# Patient Record
Sex: Male | Born: 1950 | Race: Black or African American | Hispanic: No | Marital: Married | Smoking: Former smoker
Health system: Southern US, Community
[De-identification: ages and names within clinical notes are randomized; demographics above are authoritative.]

## PROBLEM LIST (undated history)

## (undated) DIAGNOSIS — C801 Malignant (primary) neoplasm, unspecified: Secondary | ICD-10-CM

## (undated) DIAGNOSIS — R9430 Abnormal result of cardiovascular function study, unspecified: Secondary | ICD-10-CM

## (undated) DIAGNOSIS — I429 Cardiomyopathy, unspecified: Secondary | ICD-10-CM

## (undated) DIAGNOSIS — N400 Enlarged prostate without lower urinary tract symptoms: Secondary | ICD-10-CM

## (undated) DIAGNOSIS — I10 Essential (primary) hypertension: Secondary | ICD-10-CM

## (undated) DIAGNOSIS — R51 Headache: Secondary | ICD-10-CM

## (undated) DIAGNOSIS — N529 Male erectile dysfunction, unspecified: Secondary | ICD-10-CM

## (undated) DIAGNOSIS — C679 Malignant neoplasm of bladder, unspecified: Secondary | ICD-10-CM

## (undated) DIAGNOSIS — I519 Heart disease, unspecified: Secondary | ICD-10-CM

## (undated) DIAGNOSIS — E785 Hyperlipidemia, unspecified: Secondary | ICD-10-CM

## (undated) DIAGNOSIS — N3289 Other specified disorders of bladder: Secondary | ICD-10-CM

## (undated) HISTORY — PX: BLADDER TUMOR EXCISION: SHX238

## (undated) HISTORY — DX: Cardiomyopathy, unspecified: I42.9

## (undated) HISTORY — DX: Heart disease, unspecified: I51.9

## (undated) HISTORY — DX: Abnormal result of cardiovascular function study, unspecified: R94.30

## (undated) HISTORY — DX: Male erectile dysfunction, unspecified: N52.9

---

## 2007-08-12 ENCOUNTER — Encounter (INDEPENDENT_AMBULATORY_CARE_PROVIDER_SITE_OTHER): Payer: Self-pay | Admitting: Urology

## 2007-08-12 ENCOUNTER — Ambulatory Visit (HOSPITAL_BASED_OUTPATIENT_CLINIC_OR_DEPARTMENT_OTHER): Admission: RE | Admit: 2007-08-12 | Discharge: 2007-08-12 | Payer: Self-pay | Admitting: Urology

## 2010-05-30 ENCOUNTER — Ambulatory Visit
Admission: RE | Admit: 2010-05-30 | Discharge: 2010-05-30 | Payer: Self-pay | Source: Home / Self Care | Admitting: Urology

## 2010-09-23 LAB — POCT I-STAT 4, (NA,K, GLUC, HGB,HCT)
Glucose, Bld: 131 mg/dL — ABNORMAL HIGH (ref 70–99)
HCT: 39 % (ref 39.0–52.0)
HCT: 41 % (ref 39.0–52.0)
Hemoglobin: 13.9 g/dL (ref 13.0–17.0)
Potassium: 3.3 mEq/L — ABNORMAL LOW (ref 3.5–5.1)
Sodium: 141 mEq/L (ref 135–145)

## 2010-09-23 LAB — POCT HEMOGLOBIN-HEMACUE: Hemoglobin: 10.2 g/dL — ABNORMAL LOW (ref 13.0–17.0)

## 2010-10-03 ENCOUNTER — Other Ambulatory Visit: Payer: Self-pay | Admitting: Urology

## 2010-10-03 ENCOUNTER — Ambulatory Visit (HOSPITAL_BASED_OUTPATIENT_CLINIC_OR_DEPARTMENT_OTHER)
Admission: RE | Admit: 2010-10-03 | Discharge: 2010-10-03 | Disposition: A | Discharge status: Home / Self Care | Payer: Commercial Managed Care - PPO | Source: Ambulatory Visit | Attending: Urology | Admitting: Urology

## 2010-10-03 DIAGNOSIS — I10 Essential (primary) hypertension: Secondary | ICD-10-CM | POA: Insufficient documentation

## 2010-10-03 DIAGNOSIS — F17200 Nicotine dependence, unspecified, uncomplicated: Secondary | ICD-10-CM | POA: Insufficient documentation

## 2010-10-03 DIAGNOSIS — C674 Malignant neoplasm of posterior wall of bladder: Secondary | ICD-10-CM | POA: Insufficient documentation

## 2010-10-03 DIAGNOSIS — Z01812 Encounter for preprocedural laboratory examination: Secondary | ICD-10-CM | POA: Insufficient documentation

## 2010-10-03 DIAGNOSIS — Z79899 Other long term (current) drug therapy: Secondary | ICD-10-CM | POA: Insufficient documentation

## 2010-10-03 LAB — POCT I-STAT 4, (NA,K, GLUC, HGB,HCT)
Glucose, Bld: 104 mg/dL — ABNORMAL HIGH (ref 70–99)
HCT: 41 % (ref 39.0–52.0)
Potassium: 3.1 mEq/L — ABNORMAL LOW (ref 3.5–5.1)
Sodium: 143 mEq/L (ref 135–145)

## 2010-10-08 NOTE — Op Note (Signed)
  Kenneth Lawrence, Kenneth Lawrence                 ACCOUNT NO.:  [id number]  MEDICAL RECORD NO.:  [id number]          PATIENT TYPE:  AMB  LOCATION:  [facility]                         FACILITY:  [facility]  PHYSICIAN:  Chandi Nicklin I. [name], M.D.DATE OF BIRTH:  1951/03/22  DATE OF PROCEDURE:  10/03/2010 DATE OF DISCHARGE:  05/30/2010                              OPERATIVE REPORT   PREOPERATIVE DIAGNOSIS:  Recurrent transitional cell carcinoma of the bladder.  POSTOPERATIVE DIAGNOSES:  Recurrent transitional cell carcinoma of the bladder.  OPERATIONS:  Cystourethroscopy, cold cup bladder biopsy, cauterization of the biopsy sites.  SURGEON:  Kollins Fenter I. [name], M.D.  ANESTHESIA:  General LMA.  PREOP PREPARATION:  After appropriate preanesthesia, the patient is brought to the operating room, placed on the operating room in dorsal supine position where general LMA anesthesia was introduced.  He was then replaced in dorsal lithotomy position where the pubis was prepped with Betadine solution and draped in usual fashion.  REVIEW OF HISTORY:  This 60 year old male has a history of recurrent high-grade, but noninvasive TCC of the bladder, requiring occasional TUR bladder tumors.  He is status post BCG bladder wash chemotherapy in the past, but still has had recurrences.  He has had grade recurrent gross hematuria since 2009.  Cystoscopy now shows recurrent tumor.  He has a positive NMP-22, PSA 2.3 and continues to smoke cigarettes occasionally. CT is negative.  Pathology has shown high-grade, but not invasive TCC.  DESCRIPTION OF PROCEDURE:  Cystourethroscopy was accomplished and shows recurrence of the back wall of the bladder cancer.  Cold cup biopsy is used to excise this tumor and the biopsy site was cauterized with the gyrus button electrode.  The patient had no bleeding.  The bladder was drained off fluid.  The patient was awakened and taken to recovery room in good condition.  He had B and O  suppository at the beginning of the procedure, Toradol at the end of procedure.  He was awakened and taken to the recovery room in good condition.     Damiano Stamper I. [name], M.D.     SIT/MEDQ  D:  10/03/2010  T:  10/03/2010  Job:  [id number]  Electronically Signed by Jethro Bolus M.D. on 10/08/2010 09:38:32 AM

## 2010-11-25 NOTE — Op Note (Signed)
Kenneth Lawrence, Kenneth Lawrence                 ACCOUNT NO.:  [id number]   MEDICAL RECORD NO.:  [id number]          PATIENT TYPE:  AMB   LOCATION:  [facility]                         FACILITY:  [facility]   PHYSICIAN:  Sigmund I. [name], M.D.DATE OF BIRTH:  1950-10-17   DATE OF PROCEDURE:  08/12/2007  DATE OF DISCHARGE:                               OPERATIVE REPORT   PREOPERATIVE DIAGNOSIS:  Recurrent bladder cancer.   POSTOPERATIVE DIAGNOSIS:  Recurrent bladder cancer.   OPERATION:  Cystourethroscopy, cold cup bladder biopsy, TUR bladder  tumor.   SURGEON:  Sigmund I. [name], M.D.   ANESTHESIA:  General LMA.   PREPARATION:  After appropriate preanesthesia, the patient was brought  to the operating room, placed on the operating room in dorsal supine  position where general LMA anesthesia was induced.  He was then replaced  in the dorsal lithotomy position where the pubis was prepped with  Betadine solution and draped in usual fashion.   REVIEW OF HISTORY:  Kenneth Lawrence is a 60 year old male, with a history of  recurrent bladder cancer, but none for approximately 20 years.  He has  developed recurrent gross hematuria, with positive NMP 22 and positive  cystoscopy, and is now for transurethral resection of bladder tumor.   PROCEDURE:  Cystoscopy was accomplished; it shows a right lateral  bladder wall bladder cancer.  Cold-cup bladder biopsies obtained, and  TUR resection was obtained.  No other areas of erythema or abnormality  were identified with the bladder.  The tissue was photo documented and  sent to laboratory for examination.  The patient was given IV Toradol,  awakened, and taken to the recovery room in good condition.  In  addition, the patient received a B&O suppository at the beginning of  procedure.      Sigmund I. [name], M.D.  Electronically Signed     SIT/MEDQ  D:  08/12/2007  T:  08/13/2007  Job:  [id number]

## 2011-04-03 LAB — I-STAT 8, (EC8 V) (CONVERTED LAB)
Bicarbonate: 27.1 — ABNORMAL HIGH
Glucose, Bld: 114 — ABNORMAL HIGH
Hemoglobin: 14.3
Sodium: 139
TCO2: 28
pCO2, Ven: 34.8 — ABNORMAL LOW
pH, Ven: 7.499 — ABNORMAL HIGH

## 2013-02-22 ENCOUNTER — Other Ambulatory Visit: Payer: Self-pay | Admitting: Urology

## 2013-03-01 ENCOUNTER — Encounter (HOSPITAL_COMMUNITY): Payer: Self-pay | Admitting: Pharmacy Technician

## 2013-03-09 ENCOUNTER — Encounter (HOSPITAL_COMMUNITY)
Admission: RE | Admit: 2013-03-09 | Discharge: 2013-03-09 | Disposition: A | Discharge status: Home / Self Care | Payer: Commercial Managed Care - PPO | Source: Ambulatory Visit | Attending: Urology | Admitting: Urology

## 2013-03-09 ENCOUNTER — Encounter (HOSPITAL_COMMUNITY): Payer: Self-pay

## 2013-03-09 ENCOUNTER — Ambulatory Visit (HOSPITAL_COMMUNITY)
Admission: RE | Admit: 2013-03-09 | Discharge: 2013-03-09 | Disposition: A | Discharge status: Home / Self Care | Payer: Commercial Managed Care - PPO | Source: Ambulatory Visit | Attending: Urology | Admitting: Urology

## 2013-03-09 DIAGNOSIS — Z01812 Encounter for preprocedural laboratory examination: Secondary | ICD-10-CM | POA: Insufficient documentation

## 2013-03-09 DIAGNOSIS — I10 Essential (primary) hypertension: Secondary | ICD-10-CM | POA: Insufficient documentation

## 2013-03-09 DIAGNOSIS — Z01818 Encounter for other preprocedural examination: Secondary | ICD-10-CM | POA: Insufficient documentation

## 2013-03-09 DIAGNOSIS — N3289 Other specified disorders of bladder: Secondary | ICD-10-CM | POA: Insufficient documentation

## 2013-03-09 HISTORY — DX: Other specified disorders of bladder: N32.89

## 2013-03-09 HISTORY — DX: Essential (primary) hypertension: I10

## 2013-03-09 HISTORY — DX: Hyperlipidemia, unspecified: E78.5

## 2013-03-09 HISTORY — DX: Headache: R51

## 2013-03-09 HISTORY — DX: Benign prostatic hyperplasia without lower urinary tract symptoms: N40.0

## 2013-03-09 HISTORY — DX: Malignant (primary) neoplasm, unspecified: C80.1

## 2013-03-09 LAB — CBC
HCT: 31.8 % — ABNORMAL LOW (ref 39.0–52.0)
Hemoglobin: 10.4 g/dL — ABNORMAL LOW (ref 13.0–17.0)
MCH: 26.7 pg (ref 26.0–34.0)
MCHC: 32.7 g/dL (ref 30.0–36.0)
MCV: 81.7 fL (ref 78.0–100.0)

## 2013-03-09 LAB — BASIC METABOLIC PANEL
BUN: 19 mg/dL (ref 6–23)
Calcium: 9.4 mg/dL (ref 8.4–10.5)
Creatinine, Ser: 1.51 mg/dL — ABNORMAL HIGH (ref 0.50–1.35)
GFR calc non Af Amer: 47 mL/min — ABNORMAL LOW (ref >90)
Glucose, Bld: 106 mg/dL — ABNORMAL HIGH (ref 70–99)

## 2013-03-09 NOTE — Patient Instructions (Addendum)
20 ALEC JAROS  03/09/2013   Your procedure is scheduled on: 9-4  -2014  Report to [facility] at   0630     AM.  Call this number if you have problems the morning of surgery: [phone]  Or Presurgical Testing [phone](Jaydalynn Olivero)      Do not eat food:After Midnight.    Take these medicines the morning of surgery with A SIP OF WATER: Amlodipine. Carvedilol. Pravastatin. Stop Aspirin x5 days prior. Stop all over the counter vitamin/herbal supplements.   Do not wear jewelry, make-up or nail polish.  Do not wear lotions, powders, or perfumes. You may wear deodorant.  Do not shave 12 hours prior to first CHG shower(legs and under arms).(face and neck okay.)  Do not bring valuables to the hospital.  Contacts, dentures or bridgework,body piercing,  may not be worn into surgery.  Leave suitcase in the car. After surgery it may be brought to your room.  For patients admitted to the hospital, checkout time is 11:00 AM the day of discharge.   Patients discharged the day of surgery will not be allowed to drive home. Must have responsible person with you x 24 hours once discharged.  Name and phone number of your driver: Salil Raineri [phone] cell  Special Instructions: CHG(Chlorhedine 4%-"Hibiclens","Betasept","Aplicare") Shower Use Special Wash: see special instructions.(avoid face and genitals)      Failure to follow these instructions may result in Cancellation of your surgery.   Patient signature_______________________________________________________

## 2013-03-09 NOTE — Pre-Procedure Instructions (Addendum)
03-09-13 EKG 10'13- report with chart

## 2013-03-16 ENCOUNTER — Inpatient Hospital Stay (HOSPITAL_COMMUNITY)
Admission: RE | Admit: 2013-03-16 | Discharge: 2013-03-18 | DRG: 657 | Disposition: A | Discharge status: Home / Self Care | Payer: Commercial Managed Care - PPO | Source: Ambulatory Visit | Attending: Urology | Admitting: Urology

## 2013-03-16 ENCOUNTER — Encounter (HOSPITAL_COMMUNITY): Payer: Self-pay | Admitting: *Deleted

## 2013-03-16 ENCOUNTER — Ambulatory Visit (HOSPITAL_COMMUNITY): Payer: Commercial Managed Care - PPO | Admitting: Anesthesiology

## 2013-03-16 ENCOUNTER — Other Ambulatory Visit: Payer: Self-pay | Admitting: Radiology

## 2013-03-16 ENCOUNTER — Encounter (HOSPITAL_COMMUNITY)
Admission: RE | Disposition: A | Discharge status: Home / Self Care | Payer: Self-pay | Source: Ambulatory Visit | Attending: Urology

## 2013-03-16 ENCOUNTER — Encounter (HOSPITAL_COMMUNITY): Payer: Self-pay | Admitting: Anesthesiology

## 2013-03-16 ENCOUNTER — Inpatient Hospital Stay (HOSPITAL_COMMUNITY): Payer: Commercial Managed Care - PPO

## 2013-03-16 DIAGNOSIS — I10 Essential (primary) hypertension: Secondary | ICD-10-CM | POA: Diagnosis present

## 2013-03-16 DIAGNOSIS — F17200 Nicotine dependence, unspecified, uncomplicated: Secondary | ICD-10-CM | POA: Diagnosis present

## 2013-03-16 DIAGNOSIS — E559 Vitamin D deficiency, unspecified: Secondary | ICD-10-CM | POA: Diagnosis present

## 2013-03-16 DIAGNOSIS — Z8551 Personal history of malignant neoplasm of bladder: Secondary | ICD-10-CM

## 2013-03-16 DIAGNOSIS — R634 Abnormal weight loss: Secondary | ICD-10-CM | POA: Diagnosis present

## 2013-03-16 DIAGNOSIS — R339 Retention of urine, unspecified: Secondary | ICD-10-CM | POA: Diagnosis not present

## 2013-03-16 DIAGNOSIS — C672 Malignant neoplasm of lateral wall of bladder: Principal | ICD-10-CM | POA: Diagnosis present

## 2013-03-16 DIAGNOSIS — N1330 Unspecified hydronephrosis: Secondary | ICD-10-CM | POA: Diagnosis present

## 2013-03-16 DIAGNOSIS — N135 Crossing vessel and stricture of ureter without hydronephrosis: Secondary | ICD-10-CM | POA: Diagnosis present

## 2013-03-16 DIAGNOSIS — R310 Gross hematuria: Secondary | ICD-10-CM | POA: Diagnosis present

## 2013-03-16 DIAGNOSIS — Z8546 Personal history of malignant neoplasm of prostate: Secondary | ICD-10-CM

## 2013-03-16 DIAGNOSIS — E785 Hyperlipidemia, unspecified: Secondary | ICD-10-CM | POA: Diagnosis present

## 2013-03-16 DIAGNOSIS — C679 Malignant neoplasm of bladder, unspecified: Secondary | ICD-10-CM

## 2013-03-16 HISTORY — PX: TRANSURETHRAL RESECTION OF BLADDER TUMOR: SHX2575

## 2013-03-16 LAB — BASIC METABOLIC PANEL
Calcium: 9.4 mg/dL (ref 8.4–10.5)
Chloride: 103 mEq/L (ref 96–112)
Creatinine, Ser: 1.44 mg/dL — ABNORMAL HIGH (ref 0.50–1.35)
GFR calc Af Amer: 58 mL/min — ABNORMAL LOW (ref >90)
GFR calc non Af Amer: 50 mL/min — ABNORMAL LOW (ref >90)

## 2013-03-16 LAB — CBC
HCT: 35.9 % — ABNORMAL LOW (ref 39.0–52.0)
Platelets: 284 10*3/uL (ref 150–400)
RDW: 13.4 % (ref 11.5–15.5)
WBC: 8.9 10*3/uL (ref 4.0–10.5)

## 2013-03-16 LAB — APTT: aPTT: 32 seconds (ref 24–37)

## 2013-03-16 LAB — PROTIME-INR: INR: 0.96 (ref 0.00–1.49)

## 2013-03-16 SURGERY — TURBT (TRANSURETHRAL RESECTION OF BLADDER TUMOR)
Anesthesia: General | Wound class: Clean Contaminated

## 2013-03-16 MED ORDER — INDIGOTINDISULFONATE SODIUM 8 MG/ML IJ SOLN
INTRAMUSCULAR | Status: DC | PRN
  Administered 2013-03-16: 5 mL via INTRAVENOUS

## 2013-03-16 MED ORDER — CARVEDILOL 12.5 MG PO TABS
12.5000 mg | ORAL_TABLET | Freq: Two times a day (BID) | ORAL | Status: DC
  Administered 2013-03-16 – 2013-03-18 (×4): 12.5 mg via ORAL
  Filled 2013-03-16 (×6): qty 1

## 2013-03-16 MED ORDER — HYDROCHLOROTHIAZIDE 25 MG PO TABS
25.0000 mg | ORAL_TABLET | Freq: Every morning | ORAL | Status: DC
  Administered 2013-03-16 – 2013-03-18 (×3): 25 mg via ORAL
  Filled 2013-03-16 (×3): qty 1

## 2013-03-16 MED ORDER — FENTANYL CITRATE 0.05 MG/ML IJ SOLN
INTRAMUSCULAR | Status: DC | PRN
  Administered 2013-03-16: 25 ug via INTRAVENOUS
  Administered 2013-03-16 (×3): 50 ug via INTRAVENOUS
  Administered 2013-03-16: 25 ug via INTRAVENOUS

## 2013-03-16 MED ORDER — DIPHENHYDRAMINE HCL 12.5 MG/5ML PO ELIX: 12.5000 mg | ORAL_SOLUTION | Freq: Four times a day (QID) | ORAL | Status: DC | PRN

## 2013-03-16 MED ORDER — PROMETHAZINE HCL 25 MG/ML IJ SOLN: 6.2500 mg | INTRAMUSCULAR | Status: DC | PRN

## 2013-03-16 MED ORDER — BACITRACIN-NEOMYCIN-POLYMYXIN 400-5-5000 EX OINT: 1.0000 "application " | TOPICAL_OINTMENT | Freq: Three times a day (TID) | CUTANEOUS | Status: DC | PRN

## 2013-03-16 MED ORDER — LIDOCAINE HCL 1 % IJ SOLN
INTRAMUSCULAR | Status: DC | PRN
  Administered 2013-03-16: 100 mg via INTRADERMAL

## 2013-03-16 MED ORDER — ONDANSETRON HCL 4 MG/2ML IJ SOLN: 4.0000 mg | INTRAMUSCULAR | Status: DC | PRN

## 2013-03-16 MED ORDER — AMLODIPINE BESYLATE 10 MG PO TABS
10.0000 mg | ORAL_TABLET | Freq: Every morning | ORAL | Status: DC
  Administered 2013-03-17 – 2013-03-18 (×2): 10 mg via ORAL
  Filled 2013-03-16 (×2): qty 1

## 2013-03-16 MED ORDER — MAGNESIUM CITRATE PO SOLN: 1.0000 | Freq: Once | ORAL | Status: AC | PRN

## 2013-03-16 MED ORDER — OXYCODONE-ACETAMINOPHEN 5-325 MG PO TABS: 1.0000 | ORAL_TABLET | ORAL | Status: DC | PRN

## 2013-03-16 MED ORDER — IOHEXOL 300 MG/ML  SOLN
INTRAMUSCULAR | Status: AC
  Filled 2013-03-16: qty 1

## 2013-03-16 MED ORDER — SODIUM CHLORIDE 0.9 % IR SOLN
Status: DC | PRN
  Administered 2013-03-16: 18000 mL via INTRAVESICAL

## 2013-03-16 MED ORDER — FENTANYL CITRATE 0.05 MG/ML IJ SOLN
25.0000 ug | INTRAMUSCULAR | Status: DC | PRN
  Administered 2013-03-16 (×2): 50 ug via INTRAVENOUS

## 2013-03-16 MED ORDER — POTASSIUM CHLORIDE CRYS ER 20 MEQ PO TBCR
20.0000 meq | EXTENDED_RELEASE_TABLET | Freq: Every day | ORAL | Status: DC
  Administered 2013-03-16 – 2013-03-18 (×3): 20 meq via ORAL
  Filled 2013-03-16 (×3): qty 1

## 2013-03-16 MED ORDER — MIDAZOLAM HCL 2 MG/2ML IJ SOLN
INTRAMUSCULAR | Status: AC
  Filled 2013-03-16: qty 4

## 2013-03-16 MED ORDER — DIPHENHYDRAMINE HCL 50 MG/ML IJ SOLN: 12.5000 mg | Freq: Four times a day (QID) | INTRAMUSCULAR | Status: DC | PRN

## 2013-03-16 MED ORDER — HYDROMORPHONE HCL PF 1 MG/ML IJ SOLN
0.5000 mg | INTRAMUSCULAR | Status: DC | PRN
Start: 2013-03-16 — End: 2013-03-18

## 2013-03-16 MED ORDER — BISACODYL 5 MG PO TBEC: 5.0000 mg | DELAYED_RELEASE_TABLET | Freq: Every day | ORAL | Status: DC | PRN

## 2013-03-16 MED ORDER — SODIUM CHLORIDE 0.45 % IV SOLN
INTRAVENOUS | Status: DC
  Administered 2013-03-16: 17:00:00 100 mL/h via INTRAVENOUS
  Administered 2013-03-17 (×2): via INTRAVENOUS
  Administered 2013-03-17: 11:00:00 100 mL/h via INTRAVENOUS
  Administered 2013-03-18: 07:00:00 via INTRAVENOUS

## 2013-03-16 MED ORDER — LABETALOL HCL 5 MG/ML IV SOLN
INTRAVENOUS | Status: DC | PRN
  Administered 2013-03-16: 5 mg via INTRAVENOUS

## 2013-03-16 MED ORDER — SIMVASTATIN 5 MG PO TABS
5.0000 mg | ORAL_TABLET | Freq: Every day | ORAL | Status: DC
  Administered 2013-03-16 – 2013-03-17 (×2): 5 mg via ORAL
  Filled 2013-03-16 (×4): qty 1

## 2013-03-16 MED ORDER — LACTATED RINGERS IV SOLN
INTRAVENOUS | Status: DC
  Administered 2013-03-16: 1000 mL via INTRAVENOUS
  Administered 2013-03-16: 10:00:00 via INTRAVENOUS

## 2013-03-16 MED ORDER — ZOLPIDEM TARTRATE 5 MG PO TABS: 5.0000 mg | ORAL_TABLET | Freq: Every evening | ORAL | Status: DC | PRN

## 2013-03-16 MED ORDER — CEFAZOLIN SODIUM-DEXTROSE 2-3 GM-% IV SOLR
INTRAVENOUS | Status: AC
  Filled 2013-03-16: qty 50

## 2013-03-16 MED ORDER — MEPERIDINE HCL 50 MG/ML IJ SOLN: 6.2500 mg | INTRAMUSCULAR | Status: DC | PRN

## 2013-03-16 MED ORDER — CIPROFLOXACIN IN D5W 400 MG/200ML IV SOLN: 400.0000 mg | Freq: Once | INTRAVENOUS | Status: DC

## 2013-03-16 MED ORDER — IOHEXOL 300 MG/ML  SOLN
10.0000 mL | Freq: Once | INTRAMUSCULAR | Status: AC | PRN
  Administered 2013-03-16: 5 mL

## 2013-03-16 MED ORDER — CEFAZOLIN SODIUM-DEXTROSE 2-3 GM-% IV SOLR
2.0000 g | INTRAVENOUS | Status: AC
  Administered 2013-03-16: 15:00:00 2 g via INTRAVENOUS
  Filled 2013-03-16: qty 50

## 2013-03-16 MED ORDER — BELLADONNA ALKALOIDS-OPIUM 16.2-60 MG RE SUPP
RECTAL | Status: AC
  Filled 2013-03-16: qty 1

## 2013-03-16 MED ORDER — LACTATED RINGERS IV SOLN: INTRAVENOUS | Status: DC

## 2013-03-16 MED ORDER — INDIGOTINDISULFONATE SODIUM 8 MG/ML IJ SOLN
INTRAMUSCULAR | Status: AC
  Filled 2013-03-16: qty 10

## 2013-03-16 MED ORDER — FENTANYL CITRATE 0.05 MG/ML IJ SOLN
INTRAMUSCULAR | Status: AC
  Filled 2013-03-16: qty 2

## 2013-03-16 MED ORDER — CEFAZOLIN SODIUM-DEXTROSE 2-3 GM-% IV SOLR
2.0000 g | INTRAVENOUS | Status: AC
  Administered 2013-03-16: 2 g via INTRAVENOUS

## 2013-03-16 MED ORDER — PROPOFOL 10 MG/ML IV BOLUS
INTRAVENOUS | Status: DC | PRN
  Administered 2013-03-16: 200 mg via INTRAVENOUS

## 2013-03-16 MED ORDER — OMEGA-3 FATTY ACIDS 1000 MG PO CAPS: 2.0000 g | ORAL_CAPSULE | Freq: Every day | ORAL | Status: DC

## 2013-03-16 MED ORDER — FENTANYL CITRATE 0.05 MG/ML IJ SOLN
INTRAMUSCULAR | Status: AC | PRN
  Administered 2013-03-16: 100 ug via INTRAVENOUS

## 2013-03-16 MED ORDER — BELLADONNA ALKALOIDS-OPIUM 16.2-60 MG RE SUPP
RECTAL | Status: DC | PRN
  Administered 2013-03-16: 1 via RECTAL

## 2013-03-16 MED ORDER — FENTANYL CITRATE 0.05 MG/ML IJ SOLN
INTRAMUSCULAR | Status: AC
  Filled 2013-03-16: qty 4

## 2013-03-16 MED ORDER — MIDAZOLAM HCL 5 MG/5ML IJ SOLN
INTRAMUSCULAR | Status: DC | PRN
  Administered 2013-03-16: 2 mg via INTRAVENOUS

## 2013-03-16 MED ORDER — VITAMIN C 500 MG PO TABS
500.0000 mg | ORAL_TABLET | Freq: Every day | ORAL | Status: DC
  Administered 2013-03-16 – 2013-03-18 (×3): 500 mg via ORAL
  Filled 2013-03-16 (×3): qty 1

## 2013-03-16 MED ORDER — VITAMIN D3 25 MCG (1000 UNIT) PO TABS
2000.0000 [IU] | ORAL_TABLET | Freq: Every day | ORAL | Status: DC
  Administered 2013-03-16 – 2013-03-18 (×3): 2000 [IU] via ORAL
  Filled 2013-03-16 (×3): qty 2

## 2013-03-16 MED ORDER — OXYBUTYNIN CHLORIDE 5 MG PO TABS
5.0000 mg | ORAL_TABLET | Freq: Three times a day (TID) | ORAL | Status: DC | PRN
  Administered 2013-03-17: 02:00:00 5 mg via ORAL
  Filled 2013-03-16: qty 1

## 2013-03-16 MED ORDER — MIDAZOLAM HCL 2 MG/2ML IJ SOLN
INTRAMUSCULAR | Status: AC | PRN
  Administered 2013-03-16: 2 mg via INTRAVENOUS

## 2013-03-16 MED ORDER — CIPROFLOXACIN HCL 500 MG PO TABS
500.0000 mg | ORAL_TABLET | Freq: Two times a day (BID) | ORAL | Status: DC
  Administered 2013-03-16 – 2013-03-18 (×4): 500 mg via ORAL
  Filled 2013-03-16 (×6): qty 1

## 2013-03-16 MED ORDER — ONDANSETRON HCL 4 MG/2ML IJ SOLN
INTRAMUSCULAR | Status: DC | PRN
  Administered 2013-03-16: 4 mg via INTRAVENOUS

## 2013-03-16 MED ORDER — OMEGA-3-ACID ETHYL ESTERS 1 G PO CAPS
1.0000 g | ORAL_CAPSULE | Freq: Every day | ORAL | Status: DC
  Administered 2013-03-16 – 2013-03-18 (×3): 1 g via ORAL
  Filled 2013-03-16 (×3): qty 1

## 2013-03-16 SURGICAL SUPPLY — 22 items
BAG URINE DRAINAGE (UROLOGICAL SUPPLIES) ×2 IMPLANT
BAG URO CATCHER STRL LF (DRAPE) ×3 IMPLANT
CATH HEMA 3WAY 30CC 24FR COUDE (CATHETERS) ×2 IMPLANT
CLOTH BEACON ORANGE TIMEOUT ST (SAFETY) ×3 IMPLANT
DRAPE CAMERA CLOSED 9X96 (DRAPES) ×3 IMPLANT
ELECT BUTTON HF 24-28F 2 30DE (ELECTRODE) ×1 IMPLANT
ELECT LOOP MED HF 24F 12D (CUTTING LOOP) ×1 IMPLANT
ELECT LOOP MED HF 24F 12D CBL (CLIP) ×3 IMPLANT
ELECT RESECT VAPORIZE 12D CBL (ELECTRODE) ×3 IMPLANT
GLOVE BIOGEL M STRL SZ7.5 (GLOVE) ×3 IMPLANT
GOWN STRL NON-REIN LRG LVL3 (GOWN DISPOSABLE) ×3 IMPLANT
GOWN STRL REIN XL XLG (GOWN DISPOSABLE) ×3 IMPLANT
HOLDER FOLEY CATH W/STRAP (MISCELLANEOUS) IMPLANT
IV NS IRRIG 3000ML ARTHROMATIC (IV SOLUTION) ×2 IMPLANT
KIT ASPIRATION TUBING (SET/KITS/TRAYS/PACK) ×3 IMPLANT
MANIFOLD NEPTUNE II (INSTRUMENTS) ×3 IMPLANT
NS IRRIG 1000ML POUR BTL (IV SOLUTION) ×3 IMPLANT
PACK CYSTO (CUSTOM PROCEDURE TRAY) ×3 IMPLANT
SCRUB PCMX 4 OZ (MISCELLANEOUS) ×3 IMPLANT
SYR 30ML LL (SYRINGE) ×2 IMPLANT
SYRINGE IRR TOOMEY STRL 70CC (SYRINGE) IMPLANT
TUBING CONNECTING 10 (TUBING) ×3 IMPLANT

## 2013-03-16 NOTE — H&P (Signed)
Problems  1. Acute Cystitis 595.0 2. Benign Localized Prostatic Hyperplasia With Urinary Obstruction 600.21 3. Carcinoma Of The Bladder 188.9 4. Dysuria 788.1 5. Gross Hematuria 599.71 6. Microscopic Hematuria 599.72 7. Paternal history of  Prostate Cancer V16.42 8. Urinary Frequency 788.41 9. Urinary Frequency 788.41 10. Working diagnosis of  Urinary Tract Infection 599.0 11. Visit For: Screening Exam Malignant Neoplasm Prostate V76.44 12. Vitamin D Deficiency 268.9  PSA,Elevated 790.93    History of Present Illness           62 yo AA male seen today for intermittent gross hematuria and hx of bladder cancer. He has co-incidental 8 lb weight loss in the last 4 months. He is s/p cysto/TURBT on 10/03/10.  Path showed low grade TCC, no invasion.  He complains of low grade urethral discomfort and low back pain with voiding, probably associatecd with gross hematuria. Last cysto negative in April, 2014.         Patient has completed his 1st cycle of full strength BCG/Intron A (last treatment was on 09/05/10).  He did well with the treatments w/o any problems.  Hx of 25 years post high grade, but non-invasive, TCC bladder.  He was treated with BCG bladder wash chemotherapy in the past, and has had recurrence, in Jan 2009, with complaint of gross hematuria. Cysto showed recurrence of tumor, and TUR-BT path again showed high grade non-invasive TCC.       He is voiding well now with IPSS=7/7. Still smokes cigarettes occasionally, about 1 pack/week. Occasional cigar. (boredom)  04/27/12  PSA - 3.74 04/21/11  PSA - 4.06          05/06/10  PSA was 2.83.   Past Medical History Problems  1. History of  Bladder Cancer V10.51 2. History of  Hypercholesterolemia 272.0 3. History of  Hypertension 401.9 4. History of  Murmurs 785.2  Surgical History Problems  1. History of  Cystoscopy Bladder Tumor 596.9 2. History of  Cystoscopy With Fulguration Small Lesion (5-51mm) 3. History of  Cystoscopy  With Fulguration Small Lesion (5-20mm) 4. History of  Cystoscopy With Fulguration Small Lesion (5-40mm)  Current Meds 1. AmLODIPine Besylate 10 MG Oral Tablet; Therapy: (Recorded:31Dec2008) to 2. Aspirin 81 MG Oral Tablet; Therapy: (Recorded:31Dec2008) to 3. Carvedilol 12.5 MG Oral Tablet; Therapy: (Recorded:31Dec2008) to 4. Ciprofloxacin HCl 500 MG Oral Tablet; TAKE 1 TABLET TWICE DAILY; Therapy: 07Aug2014 to  (Evaluate:14Aug2014)  Requested for: 07Aug2014; Last Rx:07Aug2014 5. Fish Oil 1000 MG Oral Capsule; Therapy: (Recorded:25Oct2011) to 6. Hydrochlorothiazide 25 MG Oral Tablet; Therapy: (Recorded:31Dec2008) to 7. Klor-Con M20 TBCR; Therapy: (Recorded:31Dec2008) to 8. Levitra 20 MG Oral Tablet; Therapy: (Recorded:25Oct2011) to 9. Pravastatin Sodium 20 MG Oral Tablet; Therapy: (Recorded:31Dec2008) to 10. Vitamin C TABS; Therapy: (Recorded:25Oct2011) to 11. Vitamin D3 1000 UNIT Oral Tablet; Therapy: (Recorded:25Oct2011) to  Allergies Medication  1. No Known Drug Allergies  Family History Problems  1. Paternal history of  Brain Cancer V16.8 2. Maternal history of  Breast Cancer V16.3 3. Family history of  Family Health Status Number Of Children 2 sons, 1 daughter 4. Family history of  Father Deceased At Age ____ 63, brain cancer 5. Paternal history of  Heart Disease V17.49 6. Paternal history of  Hematuria 7. Family history of  Mother Deceased At Age ____ 36, breast cancer 8. Sororal history of  Nephrolithiasis 9. Paternal history of  Prostate Cancer V16.42 10. Paternal history of  Stroke Syndrome V17.1  Social History Problems  1. Alcohol Use 2 per day 2.  Marital History - Currently Married 3. Occupation: Merchandiser, retail 4. Tobacco Use V15.82 1/2 ppd x 3 yrs Denied  5. Caffeine Use  Review of Systems Constitutional, skin, eye, otolaryngeal, hematologic/lymphatic, cardiovascular, pulmonary, endocrine, musculoskeletal, gastrointestinal, neurological and psychiatric  system(s) were reviewed and pertinent findings if present are noted.  Genitourinary: feelings of urinary urgency, dysuria, nocturia, hematuria and inguinal pain, but no difficulty starting the urinary stream, urine stream is not weak, urinary stream does not start and stop, no incomplete emptying of bladder, no post-void dribbling, no urethral discharge, urine is not foul-smelling, urine not cloudy and initiating urination does not require straining.  Gastrointestinal: constipation, but no nausea, no vomiting and no diarrhea.  Constitutional: feeling tired (fatigue) and recent 8 lbs lb weight loss.  Psychiatric: anxiety.    Physical Exam Constitutional: Well nourished and well developed . No acute distress.  ENT:. The ears and nose are normal in appearance.  Pulmonary: No respiratory distress and normal respiratory rhythm and effort.  Cardiovascular: Heart rate and rhythm are normal . No peripheral edema.  Abdomen: The abdomen is soft and nontender. No masses are palpated. No CVA tenderness. No hernias are palpable. No hepatosplenomegaly noted.  Rectal: Rectal exam demonstrates normal sphincter tone, no tenderness, no masses and no residual hemorrhoidal skin tags seen. Estimated prostate size is 3+. Normal rectal tone, no rectal masses, prostate is smooth, symmetric and non-tender. The prostate has no nodularity and is not tender. The left seminal vesicle is nonpalpable. The right seminal vesicle is nonpalpable. The perineum is normal on inspection, no perineal tenderness.  Genitourinary: Examination of the penis demonstrates no tenderness, no discharge, no masses, no adherence of the prepuce, no lesions, a normal meatus and no meatal stenosis. The penis is uncircumcised. The scrotum is normal in appearance, not tender and without lesions. The right vas deferens is is palpably normal. The left vas deferens is palpably normal. The right epididymis is palpably normal and non-tender. The left epididymis is  palpably normal and non-tender. The right testis is palpably normal, non-tender and without masses. The left testis is normal, non-tender and without masses.  Lymphatics: The femoral and inguinal nodes are not enlarged or tender.  Skin: Normal skin turgor, no visible rash and no visible skin lesions.  Neuro/Psych:. Mood and affect are appropriate.    Results/Data Selected Results  AU CT-HEMATURIA PROTOCOL 07Aug2014 12:00AM Seward Grater   Test Name Result Flag Reference  ** RADIOLOGY REPORT BY [facility] **   *RADIOLOGY REPORT*  Clinical Data: Hematuria. History of bladder cancer.  CT ABDOMEN AND PELVIS WITHOUT AND WITH CONTRAST  Technique: Multidetector CT imaging of the abdomen and pelvis was performed without contrast material in one or both body regions, followed by contrast material(s) and further sections in one or both body regions.  Contrast: 125 ml Isovue 300 IV  Comparison: 05/08/2010  Findings: Mild dependent atelectasis in the bilateral lower lobes.  Liver, spleen, pancreas, and adrenal glands are within normal limits.  Gallbladder is unremarkable. No intrahepatic or extrahepatic ductal dilatation.  Mildly diminished enhancement of the right kidney (relative to the left) with moderate right hydroureter nephrosis. Left kidney is within normal limits.  No evidence of bowel obstruction.  Atherosclerotic calcifications of the abdominal aorta and branch vessels.  No abdominopelvic ascites.  No suspicious abdominopelvic lymphadenopathy.  Prostate is within the upper limits of normal for size.  5.1 x 3.2 x 4.9 cm mass along the right posterolateral bladder wall (series 3/image 78), reflecting primary bladder neoplasm. Possible  perivesical extension/involvement of the distal right ureter (series 3/image 78).  The right ureter remains unopacified on delayed imaging. The filling defect related to the suspected bladder neoplasm persists (series  7/image 53).  Mild degenerative changes of the visualized thoracolumbar spine.  IMPRESSION: 5.1 cm right posterolateral bladder mass, reflecting primary bladder neoplasm.  Possible perivascular extension/involvement of the distal right ureter. Cystoscopic correlation is suggested.  Secondary moderate right hydroureteronephrosis.  No evidence of metastatic disease in the abdomen/pelvis.   Original Report Authenticated By: Charline Bills, M.D.   Procedure  Procedure: Cystoscopy  Chaperone Present: laurra.  Indication: Hematuria. History of Urothelial Carcinoma.  Informed Consent: Risks, benefits, and potential adverse events were discussed and informed consent was obtained from the patient.  Prep: The patient was prepped with betadine.  Anesthesia:. Local anesthesia was administered intraurethrally with 2% lidocaine jelly.  Antibiotic prophylaxis: Ciprofloxacin.  Procedure Note:  Urethral meatus:. No abnormalities.  Anterior urethra: No abnormalities.  Prostatic urethra:. The lateral prostatic lobes were enlarged. No intravesical median lobe was visualized.  Bladder: Visulization was clear. The right ureteral orifice was not able to be identified. The left ureteral orifice was in the normal anatomic position. A solitary tumor was visualized in the bladder. A nodular tumor was seen in the bladder measuring approximately 6 cm in size. This tumor was located on the right side, on the anterior aspect, near the trigone of the bladder. The patient tolerated the procedure well.  Complications: None.    Assessment Assessed  1. Carcinoma Of The Bladder 188.9 2. Gross Hematuria 599.71 3. Paternal history of  Prostate Cancer V40.42   62 yo AA male with 5.5 cm R sided trigonal and side wall bladder mass, with Right ureteral obstruction and hydronephrosis by CT scan. he has8 lb weight loss in last 4 months. He has had multiple bladder cancers over the last 30+ years, and never had invasive  disease, but this would appear to be a large muscle invasive mass. he has a family hx of prostate cancer, ( father), but he has normal rectal exam and normal psa.    He needs TUR-BT and IV indigo carmine, and possibly perc nephrostomy tube. Further Dx/Rx pending pathology report.   Plan Carcinoma Of The Bladder (188.9)  1. CREATININE with eGFR  Requested for: 08Aug2014 2. Follow-up After Test Office  Follow-up  Requested for: 08Aug2014 FamHx: Prostate Cancer (V16.42)  3. PSA REFLEX TO FREE  Requested for: 08Aug2014 Health Maintenance (V70.0)  4. UA With REFLEX  Done: 08Aug2014   Schedule surgery ASAP.   Signatures Electronically signed by : Jethro Bolus, M.D.; Feb 17 2013  9:36AM

## 2013-03-16 NOTE — Op Note (Signed)
Pre-operative diagnosis :   10 cm right lateral bladder wall bladder mass  Postoperative diagnosis: Same     Operation:  Cystourethroscopy, transurethral resection of 10 cm right lateral bladder wall bladder mass   Surgeon:  S. [name], MD  First assistant:  None   Anesthesia:  General LMA   Preparation:  After appropriate preanesthesia, the patient was brought to the operating room, placed on the operating table in the dorsal supine position where general LMA anesthesia was introduced. He was replaced in the dorsal lithotomy position with pubis was prepped with Betadine solution and draped in usual fashion. Armband was double checked. History was double checked.   Review history:  62 yo AA male seen today for intermittent gross hematuria and hx of bladder cancer. He has co-incidental 8 lb weight loss in the last 4 months. He is s/p cysto/TURBT on 10/03/10. Path showed low grade TCC, no invasion. He complains of low grade urethral discomfort and low back pain with voiding, probably associatecd with gross hematuria. Last cysto negative in April, 2014.  Patient has completed his 1st cycle of full strength BCG/Intron A (last treatment was on 09/05/10). He did well with the treatments w/o any problems. Hx of 25 years post high grade, but non-invasive, TCC bladder. He was treated with BCG bladder wash chemotherapy in the past, and has had recurrence, in Jan 2009, with complaint of gross hematuria. Cysto showed recurrence of tumor, and TUR-BT path again showed high grade non-invasive TCC.  He is voiding well now with IPSS=7/7. Still smokes cigarettes occasionally, about 1 pack/week. Occasional cigar. (boredom)  04/27/12 PSA - 3.74  04/21/11 PSA - 4.06  05/06/10 PSA was 2.83.    Statement of  Likelihood of Success: Excellent. TIME-OUT observed.:  Procedure:  Cystourethroscopy was accomplished, and shows open bladder neck, with minimal by lobar BPH. Within the bladder neck, on the right side, a large  solid mass was identified. The right hemitrigone was not identifiable. The left hemitrigone appeared normal. There was no ureteral orifice identifiable. Indigocarmine was given, but no indigo carmine was ever identified, either from the left or the right orifice. There was blue contrast seen in the bladder during the procedure, however.  The continuous flow resectoscope sheath was placed, and resection was accomplished of a very large right-sided bladder wall bladder mass with photodocumentation accomplished. The mass appeared to be highly necrotic, and highly vascular. The mass also appeared to have much mucus within it. Masses thought to represent a necrotic transitional cell carcinoma, transitional cell carcinoma invasive into the median lobe of prostate, adenocarcinoma of the bladder, or metastatic adenocarcinoma of the rectum into the bladder. Chips of the resection were sent to the pathologist with notes of concern. The patient underwent extensive coagulation of the resection bed, and a size 24 three-way hematuria catheter was placed with mild traction, and continuous irrigation. The patient was awakened, and taken to recovery room in good condition. He received IV antibiotic at the beginning of the procedure.

## 2013-03-16 NOTE — Care Management Note (Signed)
    Page 1 of 1   03/16/2013     1:34:28 PM   CARE MANAGEMENT NOTE 03/16/2013  Patient:  Kenneth Lawrence, Kenneth Lawrence   Account Number:  [id number]  Date Initiated:  03/16/2013  Documentation initiated by:  Lanier Clam  Subjective/Objective Assessment:   62Y/O M ADMITTED W/R BLADDER WALL MASS.     Action/Plan:   FROM HOME W/SPOUSE.HAS PCP,PHARMACY.   Anticipated DC Date:  03/17/2013   Anticipated DC Plan:  HOME/SELF CARE      DC Planning Services  CM consult      Choice offered to / List presented to:             Status of service:  In process, will continue to follow Medicare Important Message given?   (If response is "NO", the following Medicare IM given date fields will be blank) Date Medicare IM given:   Date Additional Medicare IM given:    Discharge Disposition:    Per UR Regulation:  Reviewed for med. necessity/level of care/duration of stay  If discussed at Long Length of Stay Meetings, dates discussed:    Comments:  03/16/13 Terasa Orsini RN,BSN NCM [phone] S/P CYSTOURETHROSCOPY.

## 2013-03-16 NOTE — Anesthesia Postprocedure Evaluation (Signed)
  Anesthesia Post-op Note  Patient: Kenneth Lawrence  Procedure(s) Performed: Procedure(s) (LRB): TRANSURETHRAL RESECTION OF BLADDER TUMOR (TURBT) (N/A)  Patient Location: PACU  Anesthesia Type: General  Level of Consciousness: awake and alert   Airway and Oxygen Therapy: Patient Spontanous Breathing  Post-op Pain: mild  Post-op Assessment: Post-op Vital signs reviewed, Patient's Cardiovascular Status Stable, Respiratory Function Stable, Patent Airway and No signs of Nausea or vomiting  Last Vitals:  Filed Vitals:   03/16/13 1145  BP: 150/70  Pulse: 62  Temp: 36.7 C  Resp:     Post-op Vital Signs: stable   Complications: No apparent anesthesia complications

## 2013-03-16 NOTE — Progress Notes (Signed)
Patient ID: Kenneth Lawrence, male   DOB: 1951/03/23, 62 y.o.   MRN: [id number] Request received for placement of a right percutaneous nephrostomy tube in pt with hx of hematuria, recurrent bladder carcinoma (right bladder mass- s/p TURBT 9/4), ureteral orifice obstruction and right hydronephrosis. Additional PMH as below. Imaging studies were reviewed by Dr. Lowella Dandy. Exam: pt awake/sl drowsy; chest- CTA bilat; heart- RRR; abd- soft,+BS,NT; ext- FROM, no edema; no rt CVA tenderness.   Filed Vitals:   03/16/13 1100 03/16/13 1115 03/16/13 1130 03/16/13 1145  BP: 162/74 160/77 149/71 150/70  Pulse: 72 72 70 62  Temp:   98.2 F (36.8 C) 98.1 F (36.7 C)  TempSrc:    Oral  Resp: 12 12 13    Height:    5\' 9"  (1.753 m)  Weight:    170 lb (77.111 kg)  SpO2: 100% 100% 100% 100%   Past Medical History  Diagnosis Date  . Hypertension   . Bladder mass   . Prostate enlargement   . Hyperlipidemia   . Headache(784.0)     hx. migraines  . Cancer     bladder cancer-hx. of past tumors   Past Surgical History  Procedure Laterality Date  . Bladder tumor excision      Multiple times , none in 10 yrs  Dg Chest 2 View  03/09/2013   CLINICAL DATA:  Preop prostate surgery. History of hypertension, smoker.  EXAM: CHEST  2 VIEW  COMPARISON:  None.  FINDINGS: The heart size and mediastinal contours are within normal limits. Both lungs are clear. The visualized skeletal structures are unremarkable.  IMPRESSION: No active cardiopulmonary disease.   Electronically Signed   By: Charlett Nose   On: 03/09/2013 09:53  Results for orders placed during the hospital encounter of 03/09/13  CBC      Result Value Range   WBC 5.3  4.0 - 10.5 K/uL   RBC 3.89 (*) 4.22 - 5.81 MIL/uL   Hemoglobin 10.4 (*) 13.0 - 17.0 g/dL   HCT 40.9 (*) 81.1 - 91.4 %   MCV 81.7  78.0 - 100.0 fL   MCH 26.7  26.0 - 34.0 pg   MCHC 32.7  30.0 - 36.0 g/dL   RDW 78.2  95.6 - 21.3 %   Platelets 261  150 - 400 K/uL  BASIC METABOLIC PANEL      Result  Value Range   Sodium 137  135 - 145 mEq/L   Potassium 3.4 (*) 3.5 - 5.1 mEq/L   Chloride 99  96 - 112 mEq/L   CO2 30  19 - 32 mEq/L   Glucose, Bld 106 (*) 70 - 99 mg/dL   BUN 19  6 - 23 mg/dL   Creatinine, Ser 0.86 (*) 0.50 - 1.35 mg/dL   Calcium 9.4  8.4 - 57.8 mg/dL   GFR calc non Af Amer 47 (*) >90 mL/min   GFR calc Af Amer 55 (*) >90 mL/min   A/P: Pt with hx of recurrent bladder carcinoma; now with right hydronephrosis, ureteral orifice obstructions, renal insuff., s/p TURBT 9/4 am. Plan is for right PCN placement today. Details/risks of procedure d/w pt/wife with their understanding and consent.

## 2013-03-16 NOTE — Transfer of Care (Signed)
Immediate Anesthesia Transfer of Care Note  Patient: Kenneth Lawrence  Procedure(s) Performed: Procedure(s): TRANSURETHRAL RESECTION OF BLADDER TUMOR (TURBT) (N/A)  Patient Location: PACU  Anesthesia Type:General  Level of Consciousness: awake, alert  and oriented  Airway & Oxygen Therapy: Patient Spontanous Breathing and Patient connected to face mask oxygen  Post-op Assessment: Report given to PACU RN, Post -op Vital signs reviewed and stable and Patient moving all extremities X 4  Post vital signs: Reviewed and stable  Complications: No apparent anesthesia complications

## 2013-03-16 NOTE — Interval H&P Note (Signed)
History and Physical Interval Note:  03/16/2013 9:13 AM  Kenneth Lawrence  has presented today for surgery, with the diagnosis of Bladder Mass  The various methods of treatment have been discussed with the patient and family. After consideration of risks, benefits and other options for treatment, the patient has consented to  Procedure(s) with comments: TRANSURETHRAL RESECTION OF BLADDER TUMOR (TURBT) (N/A) - 90 mins req for this case  C-ARM  **OUTPATIENT WITH OBSERVATION**  INDIGO CARMINE IV AT BEGINNING OF CASE CYSTOSCOPY WITH RETROGRADE PYELOGRAM/URETERAL STENT PLACEMENT (Right) - POSS (RT) RPG  POSS JJ STENT as a surgical intervention .  The patient's history has been reviewed, patient examined, no change in status, stable for surgery.  I have reviewed the patient's chart and labs.  Questions were answered to the patient's satisfaction.     [name]

## 2013-03-16 NOTE — Anesthesia Preprocedure Evaluation (Addendum)
Anesthesia Evaluation  Patient identified by MRN, date of birth, ID band Patient awake    Reviewed: Allergy & Precautions, H&P , NPO status , Patient's Chart, lab work & pertinent test results  Airway Mallampati: II TM Distance: >3 FB Neck ROM: Full    Dental no notable dental hx. (+) Caps   Pulmonary neg pulmonary ROS, Current Smoker,  breath sounds clear to auscultation  Pulmonary exam normal       Cardiovascular hypertension, Pt. on medications negative cardio ROS  Rhythm:Regular Rate:Normal     Neuro/Psych negative neurological ROS  negative psych ROS   GI/Hepatic negative GI ROS, Neg liver ROS, hiatal hernia,   Endo/Other  negative endocrine ROS  Renal/GU negative Renal ROS  negative genitourinary   Musculoskeletal negative musculoskeletal ROS (+)   Abdominal   Peds negative pediatric ROS (+)  Hematology negative hematology ROS (+)   Anesthesia Other Findings Front lowe cap   Reproductive/Obstetrics negative OB ROS                           Anesthesia Physical Anesthesia Plan  ASA: II  Anesthesia Plan: General   Post-op Pain Management:    Induction: Intravenous  Airway Management Planned: LMA  Additional Equipment:   Intra-op Plan:   Post-operative Plan: Extubation in OR  Informed Consent: I have reviewed the patients History and Physical, chart, labs and discussed the procedure including the risks, benefits and alternatives for the proposed anesthesia with the patient or authorized representative who has indicated his/her understanding and acceptance.   Dental advisory given  Plan Discussed with: CRNA  Anesthesia Plan Comments:         Anesthesia Quick Evaluation

## 2013-03-16 NOTE — Preoperative (Signed)
Beta Blockers   Reason not to administer Beta Blockers:Not Applicable 

## 2013-03-17 ENCOUNTER — Encounter (HOSPITAL_COMMUNITY): Payer: Self-pay | Admitting: Urology

## 2013-03-17 LAB — BASIC METABOLIC PANEL
Calcium: 9.2 mg/dL (ref 8.4–10.5)
GFR calc Af Amer: 56 mL/min — ABNORMAL LOW (ref >90)
GFR calc non Af Amer: 48 mL/min — ABNORMAL LOW (ref >90)
Sodium: 137 mEq/L (ref 135–145)

## 2013-03-17 LAB — HEMOGLOBIN AND HEMATOCRIT, BLOOD
HCT: 34.1 % — ABNORMAL LOW (ref 39.0–52.0)
Hemoglobin: 11 g/dL — ABNORMAL LOW (ref 13.0–17.0)

## 2013-03-17 MED ORDER — URIBEL 118 MG PO CAPS: 1.0000 | ORAL_CAPSULE | Freq: Two times a day (BID) | ORAL | Status: DC | PRN

## 2013-03-17 MED ORDER — OXYCODONE-ACETAMINOPHEN 5-325 MG PO TABS: 1.0000 | ORAL_TABLET | ORAL | Status: DC | PRN

## 2013-03-17 MED ORDER — CIPROFLOXACIN HCL 500 MG PO TABS: 500.0000 mg | ORAL_TABLET | Freq: Two times a day (BID) | ORAL | Status: DC

## 2013-03-17 NOTE — Progress Notes (Signed)
1 Day Post-Op Subjective: Urine pink. No clots now.  Discussed surgery and findings. Offered discharge today. Will need to keep his nephrostomy tube and his foley.  .  Objective: Vital signs in last 24 hours: Temp:  [98.1 F (36.7 C)-98.7 F (37.1 C)] 98.6 F (37 C) (09/05 1000) Pulse Rate:  [57-77] 61 (09/05 1000) Resp:  [8-18] 16 (09/05 1000) BP: (108-160)/(56-102) 113/56 mmHg (09/05 1000) SpO2:  [100 %] 100 % (09/05 1000) Weight:  [77.111 kg (170 lb)] 77.111 kg (170 lb) (09/04 1145)  Intake/Output from previous day: 09/04 0701 - 09/05 0700 In: 11301.7 [P.O.:240; I.V.:1761.7] Out: 16109 [Urine:13970; Blood:50] Intake/Output this shift: Total I/O In: 3240 [P.O.:240; Other:3000] Out: 2900 [Urine:2900]  Physical Exam:  General:alert and cooperative GI: not done and soft, non tender, normal bowel sounds, no palpable masses, no organomegaly, no inguinal hernia Male genitalia: not done no penile lesions or discharge Extremities: extremities normal, atraumatic, no cyanosis or edema  Lab Results:  Recent Labs  03/16/13 1403 03/17/13 0435  HGB 11.8* 11.0*  HCT 35.9* 34.1*   BMET  Recent Labs  03/16/13 1403 03/17/13 0435  NA 142 137  K 3.7 3.8  CL 103 100  CO2 33* 30  GLUCOSE 128* 103*  BUN 15 12  CREATININE 1.44* 1.48*  CALCIUM 9.4 9.2       Recent Labs  03/16/13 1403  INR 0.96   No results found for this basename: LABURIN,  in the last 72 hours No results found for this or any previous visit.  Studies/Results: Ir Perc Nephrostomy Right  03/16/2013   *RADIOLOGY REPORT*  Clinical history:62 year old with right hydronephrosis due to a bladder mass.  The patient needs right kidney decompression.  PROCEDURE(S): ULTRASOUND AND FLUOROSCOPIC GUIDED RIGHT PERCUTANEOUS NEPHROSTOMY TUBE PLACEMENT  Physician: Rachelle Hora. Lowella Dandy, MD  Medications:Versed and Fentanyl.  A radiology nurse monitored the patient for moderate sedation.  Fluoroscopy time: 54 seconds  Contrast:  5 ml  Omnipaque-300  Procedure:Informed consent was obtained from the patient's wife because the patient was sedated earlier in the day.  The patient was placed prone.  Ultrasound confirmed moderate right hydronephrosis.  The right flank was prepped and draped in a sterile fashion.  Maximal barrier sterile technique was utilized including caps, mask, sterile gowns, sterile gloves, sterile drape, hand hygiene and skin antiseptic.  The skin was anesthetized with 1% lidocaine.  21 gauge needle was directed into a lower pole calix with ultrasound guidance.  Urine was identified and a wire was advanced into the renal collecting system.  An Accustick dilator set was placed.  Contrast was injected to confirm placement in the renal collecting system.  The tract was dilated and a 10-French multipurpose drain was placed within the renal pelvis.  The catheter was sutured to the skin.  A dressing was placed over the catheter site. Fluoroscopic and ultrasound images were taken and saved for documentation.  Findings:Moderate right hydronephrosis.  Placement of 10 French multipurpose drain within the right renal pelvis.  Complications: None  Impression:Successful placement of a right percutaneous nephrostomy tube with ultrasound and fluoroscopic guidance.   Original Report Authenticated By: Richarda Overlie, M.D.   Ir US Guide Vasc Access Right  03/16/2013   *RADIOLOGY REPORT*  Clinical history:62 year old with right hydronephrosis due to a bladder mass.  The patient needs right kidney decompression.  PROCEDURE(S): ULTRASOUND AND FLUOROSCOPIC GUIDED RIGHT PERCUTANEOUS NEPHROSTOMY TUBE PLACEMENT  Physician: Rachelle Hora. Lowella Dandy, MD  Medications:Versed and Fentanyl.  A radiology nurse monitored the patient for  moderate sedation.  Fluoroscopy time: 54 seconds  Contrast:  5 ml Omnipaque-300  Procedure:Informed consent was obtained from the patient's wife because the patient was sedated earlier in the day.  The patient was placed prone.  Ultrasound  confirmed moderate right hydronephrosis.  The right flank was prepped and draped in a sterile fashion.  Maximal barrier sterile technique was utilized including caps, mask, sterile gowns, sterile gloves, sterile drape, hand hygiene and skin antiseptic.  The skin was anesthetized with 1% lidocaine.  21 gauge needle was directed into a lower pole calix with ultrasound guidance.  Urine was identified and a wire was advanced into the renal collecting system.  An Accustick dilator set was placed.  Contrast was injected to confirm placement in the renal collecting system.  The tract was dilated and a 10-French multipurpose drain was placed within the renal pelvis.  The catheter was sutured to the skin.  A dressing was placed over the catheter site. Fluoroscopic and ultrasound images were taken and saved for documentation.  Findings:Moderate right hydronephrosis.  Placement of 10 French multipurpose drain within the right renal pelvis.  Complications: None  Impression:Successful placement of a right percutaneous nephrostomy tube with ultrasound and fluoroscopic guidance.   Original Report Authenticated By: Richarda Overlie, M.D.    Assessment/Plan: Hematuria Continue foley due to acute urinary retention post op large TUR-BT. Pt desires discharge Saturday,. RTC Monday for foley removal.  Perc tube stays in.    LOS: 1 day   Latriece Anstine I 03/17/2013, 11:24 AM

## 2013-03-17 NOTE — Discharge Summary (Signed)
Physician Discharge Summary  Patient ID: Kenneth Lawrence MRN: [id number] DOB/AGE: 09/19/1950 62 y.o.  Admit date: 03/16/2013 Discharge date: 03/17/2013  Admission Diagnoses: Bladder Mass  Discharge Diagnoses:  Bladder cancer: pathology pending Right ureteral obstruction post perc nephrostomy  Discharged Condition: good  Hospital Course:   TUR-BT and R perc nephrostomy  Consults: None  Significant Diagnostic Studies: Ir Perc Nephrostomy Right  03/16/2013   *RADIOLOGY REPORT*  Clinical history:62 year old with right hydronephrosis due to a bladder mass.  The patient needs right kidney decompression.  PROCEDURE(S): ULTRASOUND AND FLUOROSCOPIC GUIDED RIGHT PERCUTANEOUS NEPHROSTOMY TUBE PLACEMENT  Physician: Rachelle Hora. Lowella Dandy, MD  Medications:Versed and Fentanyl.  A radiology nurse monitored the patient for moderate sedation.  Fluoroscopy time: 54 seconds  Contrast:  5 ml Omnipaque-300  Procedure:Informed consent was obtained from the patient's wife because the patient was sedated earlier in the day.  The patient was placed prone.  Ultrasound confirmed moderate right hydronephrosis.  The right flank was prepped and draped in a sterile fashion.  Maximal barrier sterile technique was utilized including caps, mask, sterile gowns, sterile gloves, sterile drape, hand hygiene and skin antiseptic.  The skin was anesthetized with 1% lidocaine.  21 gauge needle was directed into a lower pole calix with ultrasound guidance.  Urine was identified and a wire was advanced into the renal collecting system.  An Accustick dilator set was placed.  Contrast was injected to confirm placement in the renal collecting system.  The tract was dilated and a 10-French multipurpose drain was placed within the renal pelvis.  The catheter was sutured to the skin.  A dressing was placed over the catheter site. Fluoroscopic and ultrasound images were taken and saved for documentation.  Findings:Moderate right hydronephrosis.  Placement of 10  French multipurpose drain within the right renal pelvis.  Complications: None  Impression:Successful placement of a right percutaneous nephrostomy tube with ultrasound and fluoroscopic guidance.   Original Report Authenticated By: Richarda Overlie, M.D.   Ir US Guide Vasc Access Right  03/16/2013   *RADIOLOGY REPORT*  Clinical history:62 year old with right hydronephrosis due to a bladder mass.  The patient needs right kidney decompression.  PROCEDURE(S): ULTRASOUND AND FLUOROSCOPIC GUIDED RIGHT PERCUTANEOUS NEPHROSTOMY TUBE PLACEMENT  Physician: Rachelle Hora. Lowella Dandy, MD  Medications:Versed and Fentanyl.  A radiology nurse monitored the patient for moderate sedation.  Fluoroscopy time: 54 seconds  Contrast:  5 ml Omnipaque-300  Procedure:Informed consent was obtained from the patient's wife because the patient was sedated earlier in the day.  The patient was placed prone.  Ultrasound confirmed moderate right hydronephrosis.  The right flank was prepped and draped in a sterile fashion.  Maximal barrier sterile technique was utilized including caps, mask, sterile gowns, sterile gloves, sterile drape, hand hygiene and skin antiseptic.  The skin was anesthetized with 1% lidocaine.  21 gauge needle was directed into a lower pole calix with ultrasound guidance.  Urine was identified and a wire was advanced into the renal collecting system.  An Accustick dilator set was placed.  Contrast was injected to confirm placement in the renal collecting system.  The tract was dilated and a 10-French multipurpose drain was placed within the renal pelvis.  The catheter was sutured to the skin.  A dressing was placed over the catheter site. Fluoroscopic and ultrasound images were taken and saved for documentation.  Findings:Moderate right hydronephrosis.  Placement of 10 French multipurpose drain within the right renal pelvis.  Complications: None  Impression:Successful placement of a right percutaneous nephrostomy tube with  ultrasound and  fluoroscopic guidance.   Original Report Authenticated By: Richarda Overlie, M.D.    Treatments: surgery:   TUR-BT and R perc nephrostomy  Discharge Exam: Blood pressure 113/56, pulse 61, temperature 98.6 F (37 C), temperature source Oral, resp. rate 16, height 5\' 9"  (1.753 m), weight 77.111 kg (170 lb), SpO2 100.00%. General appearance: alert and cooperative  Disposition: 01-Home or Self Care  Discharge Orders   Future Orders Complete By Expires   Discharge patient  As directed    Comments:     In AM       Medication List         amLODipine 10 MG tablet  Commonly known as:  NORVASC  Take 10 mg by mouth every morning.     aspirin EC 81 MG tablet  Take 81 mg by mouth daily.     carvedilol 12.5 MG tablet  Commonly known as:  COREG  Take 12.5 mg by mouth 2 (two) times daily with a meal.     cholecalciferol 1000 UNITS tablet  Commonly known as:  VITAMIN D  Take 2,000 Units by mouth daily.     ciprofloxacin 500 MG tablet  Commonly known as:  CIPRO  Take 1 tablet (500 mg total) by mouth 2 (two) times daily.     fish oil-omega-3 fatty acids 1000 MG capsule  Take 2 g by mouth daily.     hydrochlorothiazide 25 MG tablet  Commonly known as:  HYDRODIURIL  Take 25 mg by mouth every morning.     oxyCODONE-acetaminophen 5-325 MG per tablet  Commonly known as:  ROXICET  Take 1 tablet by mouth every 4 (four) hours as needed for pain.     potassium chloride SA 20 MEQ tablet  Commonly known as:  K-DUR,KLOR-CON  Take 20 mEq by mouth daily.     pravastatin 20 MG tablet  Commonly known as:  PRAVACHOL  Take 20 mg by mouth every morning.     URIBEL 118 MG Caps  Take 1 capsule (118 mg total) by mouth 3 times/day as needed-between meals & bedtime.     vitamin C 500 MG tablet  Commonly known as:  ASCORBIC ACID  Take 500 mg by mouth daily.           Follow-up Information   Follow up with [name], MD. (for foley catheter remopval Monday : call Selena Batten [phone])     Specialty:  Urology   Contact information:   [address] [place] [address] [phone]      D/c with foley and R perc tube. RTC Monday for foley removal.  OK to shower.  Cipro and percocet. SignedJethro Bolus I 03/17/2013, 12:00 PM

## 2013-03-17 NOTE — Progress Notes (Signed)
1 Day Post-Op  Subjective: Pt doing ok; has had some intermittent back spasms; denies N/V  Objective: Vital signs in last 24 hours: Temp:  [98.1 F (36.7 C)-98.7 F (37.1 C)] 98.6 F (37 C) (09/05 1000) Pulse Rate:  [57-77] 61 (09/05 1000) Resp:  [8-18] 16 (09/05 1000) BP: (108-160)/(56-102) 113/56 mmHg (09/05 1000) SpO2:  [100 %] 100 % (09/05 1000) Weight:  [170 lb (77.111 kg)] 170 lb (77.111 kg) (09/04 1145) Last BM Date: 03/15/13  Intake/Output from previous day: 09/04 0701 - 09/05 0700 In: 11301.7 [P.O.:240; I.V.:1761.7] Out: 16109 [Urine:13970; Blood:50] Intake/Output this shift: Total I/O In: 3240 [P.O.:240; Other:3000] Out: 2900 [Urine:2900]  Right PCN intact, dressing dry, site mildly tender, last recorded output 120 cc's blood-tinged urine; cath flushed with 10 cc's sterile NS without difficulty  Lab Results:   Recent Labs  03/16/13 1403 03/17/13 0435  WBC 8.9  --   HGB 11.8* 11.0*  HCT 35.9* 34.1*  PLT 284  --    BMET  Recent Labs  03/16/13 1403 03/17/13 0435  NA 142 137  K 3.7 3.8  CL 103 100  CO2 33* 30  GLUCOSE 128* 103*  BUN 15 12  CREATININE 1.44* 1.48*  CALCIUM 9.4 9.2   PT/INR  Recent Labs  03/16/13 1403  LABPROT 12.6  INR 0.96   ABG No results found for this basename: PHART, PCO2, PO2, HCO3,  in the last 72 hours  Studies/Results: Ir Perc Nephrostomy Right  03/16/2013   *RADIOLOGY REPORT*  Clinical history:62 year old with right hydronephrosis due to a bladder mass.  The patient needs right kidney decompression.  PROCEDURE(S): ULTRASOUND AND FLUOROSCOPIC GUIDED RIGHT PERCUTANEOUS NEPHROSTOMY TUBE PLACEMENT  Physician: Rachelle Hora. Lowella Dandy, MD  Medications:Versed and Fentanyl.  A radiology nurse monitored the patient for moderate sedation.  Fluoroscopy time: 54 seconds  Contrast:  5 ml Omnipaque-300  Procedure:Informed consent was obtained from the patient's wife because the patient was sedated earlier in the day.  The patient was placed  prone.  Ultrasound confirmed moderate right hydronephrosis.  The right flank was prepped and draped in a sterile fashion.  Maximal barrier sterile technique was utilized including caps, mask, sterile gowns, sterile gloves, sterile drape, hand hygiene and skin antiseptic.  The skin was anesthetized with 1% lidocaine.  21 gauge needle was directed into a lower pole calix with ultrasound guidance.  Urine was identified and a wire was advanced into the renal collecting system.  An Accustick dilator set was placed.  Contrast was injected to confirm placement in the renal collecting system.  The tract was dilated and a 10-French multipurpose drain was placed within the renal pelvis.  The catheter was sutured to the skin.  A dressing was placed over the catheter site. Fluoroscopic and ultrasound images were taken and saved for documentation.  Findings:Moderate right hydronephrosis.  Placement of 10 French multipurpose drain within the right renal pelvis.  Complications: None  Impression:Successful placement of a right percutaneous nephrostomy tube with ultrasound and fluoroscopic guidance.   Original Report Authenticated By: Richarda Overlie, M.D.   Ir US Guide Vasc Access Right  03/16/2013   *RADIOLOGY REPORT*  Clinical history:62 year old with right hydronephrosis due to a bladder mass.  The patient needs right kidney decompression.  PROCEDURE(S): ULTRASOUND AND FLUOROSCOPIC GUIDED RIGHT PERCUTANEOUS NEPHROSTOMY TUBE PLACEMENT  Physician: Rachelle Hora. Lowella Dandy, MD  Medications:Versed and Fentanyl.  A radiology nurse monitored the patient for moderate sedation.  Fluoroscopy time: 54 seconds  Contrast:  5 ml Omnipaque-300  Procedure:Informed consent was  obtained from the patient's wife because the patient was sedated earlier in the day.  The patient was placed prone.  Ultrasound confirmed moderate right hydronephrosis.  The right flank was prepped and draped in a sterile fashion.  Maximal barrier sterile technique was utilized including  caps, mask, sterile gowns, sterile gloves, sterile drape, hand hygiene and skin antiseptic.  The skin was anesthetized with 1% lidocaine.  21 gauge needle was directed into a lower pole calix with ultrasound guidance.  Urine was identified and a wire was advanced into the renal collecting system.  An Accustick dilator set was placed.  Contrast was injected to confirm placement in the renal collecting system.  The tract was dilated and a 10-French multipurpose drain was placed within the renal pelvis.  The catheter was sutured to the skin.  A dressing was placed over the catheter site. Fluoroscopic and ultrasound images were taken and saved for documentation.  Findings:Moderate right hydronephrosis.  Placement of 10 French multipurpose drain within the right renal pelvis.  Complications: None  Impression:Successful placement of a right percutaneous nephrostomy tube with ultrasound and fluoroscopic guidance.   Original Report Authenticated By: Richarda Overlie, M.D.    Anti-infectives: Anti-infectives   Start     Dose/Rate Route Frequency Ordered Stop   03/16/13 1400  ciprofloxacin (CIPRO) tablet 500 mg     500 mg Oral 2 times daily 03/16/13 1150     03/16/13 1345  ciprofloxacin (CIPRO) IVPB 400 mg  Status:  Discontinued     400 mg 200 mL/hr over 60 Minutes Intravenous  Once 03/16/13 1334 03/16/13 1335   03/16/13 1345  ceFAZolin (ANCEF) IVPB 2 g/50 mL premix     2 g 100 mL/hr over 30 Minutes Intravenous On call 03/16/13 1336 03/16/13 1539   03/16/13 0641  ceFAZolin (ANCEF) IVPB 2 g/50 mL premix     2 g 100 mL/hr over 30 Minutes Intravenous 30 min pre-op 03/16/13 1610 03/16/13 0926      Assessment/Plan: s/p right PCN 9/4 secondary to hydro/ureteral orifice obst/bladder ca; cont current tx; monitor labs; other plans as per urology   LOS: 1 day    Hazell Siwik,D [facility] 03/17/2013

## 2013-03-18 NOTE — Discharge Summary (Signed)
Physician Discharge Summary  Patient ID: Kenneth Lawrence MRN: [id number] DOB/AGE: [birth year] 62 y.o.  Admit date: 03/16/2013 Discharge date: 03/18/2013  Admission Diagnoses:  Bladder cancer   Discharge Diagnoses:  Active Problems:  bladder cancer with right ureteral obstruction  Past Medical History  Diagnosis Date  . Hypertension   . Bladder mass   . Prostate enlargement   . Hyperlipidemia   . Headache(784.0)     hx. migraines  . Cancer     bladder cancer-hx. of past tumors    Surgeries: Procedure(s): TRANSURETHRAL RESECTION OF BLADDER TUMOR (TURBT) on 03/16/2013 Right percutaneous nephrostomy    Consultants (if any):    Discharged Condition: Improved  Hospital Course: Kenneth Lawrence is an 62 y.o. male who was admitted 03/16/2013 with a diagnosis of bladder cancer with right ureteral obstruction  and went to the operating room on 03/16/2013 and underwent the above named procedures.    He was given perioperative antibiotics:  Anti-infectives   Start     Dose/Rate Route Frequency Ordered Stop   03/17/13 0000  ciprofloxacin (CIPRO) 500 MG tablet     500 mg Oral 2 times daily 03/17/13 1159     03/16/13 1400  ciprofloxacin (CIPRO) tablet 500 mg     500 mg Oral 2 times daily 03/16/13 1150     03/16/13 1345  ciprofloxacin (CIPRO) IVPB 400 mg  Status:  Discontinued     400 mg 200 mL/hr over 60 Minutes Intravenous  Once 03/16/13 1334 03/16/13 1335   03/16/13 1345  ceFAZolin (ANCEF) IVPB 2 g/50 mL premix     2 g 100 mL/hr over 30 Minutes Intravenous On call 03/16/13 1336 03/16/13 1539   03/16/13 0641  ceFAZolin (ANCEF) IVPB 2 g/50 mL premix     2 g 100 mL/hr over 30 Minutes Intravenous 30 min pre-op 03/16/13 0641 03/16/13 0926    .  He was given sequential compression devices and early ambulation for DVT prophylaxis.  He benefited maximally from the hospital stay and there were no complications.    Recent vital signs:  Filed Vitals:   03/18/13 0620  BP: 115/54  Pulse: 58   Temp: 98.1 F (36.7 C)  Resp: 16    Recent laboratory studies:  Lab Results  Component Value Date   HGB 11.0* 03/17/2013   HGB 11.8* 03/16/2013   HGB 10.4* 03/09/2013   Lab Results  Component Value Date   WBC 8.9 03/16/2013   PLT 284 03/16/2013   Lab Results  Component Value Date   INR 0.96 03/16/2013   Lab Results  Component Value Date   NA 137 03/17/2013   K 3.8 03/17/2013   CL 100 03/17/2013   CO2 30 03/17/2013   BUN 12 03/17/2013   CREATININE 1.48* 03/17/2013   GLUCOSE 103* 03/17/2013    Discharge Medications:     Medication List         amLODipine 10 MG tablet  Commonly known as:  NORVASC  Take 10 mg by mouth every morning.     aspirin EC 81 MG tablet  Take 81 mg by mouth daily.     carvedilol 12.5 MG tablet  Commonly known as:  COREG  Take 12.5 mg by mouth 2 (two) times daily with a meal.     cholecalciferol 1000 UNITS tablet  Commonly known as:  VITAMIN D  Take 2,000 Units by mouth daily.     ciprofloxacin 500 MG tablet  Commonly known as:  CIPRO  Take 1 tablet (  500 mg total) by mouth 2 (two) times daily.     fish oil-omega-3 fatty acids 1000 MG capsule  Take 2 g by mouth daily.     hydrochlorothiazide 25 MG tablet  Commonly known as:  HYDRODIURIL  Take 25 mg by mouth every morning.     oxyCODONE-acetaminophen 5-325 MG per tablet  Commonly known as:  ROXICET  Take 1 tablet by mouth every 4 (four) hours as needed for pain.     potassium chloride SA 20 MEQ tablet  Commonly known as:  K-DUR,KLOR-CON  Take 20 mEq by mouth daily.     pravastatin 20 MG tablet  Commonly known as:  PRAVACHOL  Take 20 mg by mouth every morning.     URIBEL 118 MG Caps  Take 1 capsule (118 mg total) by mouth 3 times/day as needed-between meals & bedtime.     vitamin C 500 MG tablet  Commonly known as:  ASCORBIC ACID  Take 500 mg by mouth daily.        Diagnostic Studies: Dg Chest 2 View  03/09/2013   CLINICAL DATA:  Preop prostate surgery. History of hypertension, smoker.   EXAM: CHEST  2 VIEW  COMPARISON:  None.  FINDINGS: The heart size and mediastinal contours are within normal limits. Both lungs are clear. The visualized skeletal structures are unremarkable.  IMPRESSION: No active cardiopulmonary disease.   Electronically Signed   By: Charlett Nose   On: 03/09/2013 09:53   Ir Perc Nephrostomy Right  03/16/2013   *RADIOLOGY REPORT*  Clinical history:62 year old with right hydronephrosis due to a bladder mass.  The patient needs right kidney decompression.  PROCEDURE(S): ULTRASOUND AND FLUOROSCOPIC GUIDED RIGHT PERCUTANEOUS NEPHROSTOMY TUBE PLACEMENT  Physician: Rachelle Hora. Lowella Dandy, MD  Medications:Versed and Fentanyl.  A radiology nurse monitored the patient for moderate sedation.  Fluoroscopy time: 54 seconds  Contrast:  5 ml Omnipaque-300  Procedure:Informed consent was obtained from the patient's wife because the patient was sedated earlier in the day.  The patient was placed prone.  Ultrasound confirmed moderate right hydronephrosis.  The right flank was prepped and draped in a sterile fashion.  Maximal barrier sterile technique was utilized including caps, mask, sterile gowns, sterile gloves, sterile drape, hand hygiene and skin antiseptic.  The skin was anesthetized with 1% lidocaine.  21 gauge needle was directed into a lower pole calix with ultrasound guidance.  Urine was identified and a wire was advanced into the renal collecting system.  An Accustick dilator set was placed.  Contrast was injected to confirm placement in the renal collecting system.  The tract was dilated and a 10-French multipurpose drain was placed within the renal pelvis.  The catheter was sutured to the skin.  A dressing was placed over the catheter site. Fluoroscopic and ultrasound images were taken and saved for documentation.  Findings:Moderate right hydronephrosis.  Placement of 10 French multipurpose drain within the right renal pelvis.  Complications: None  Impression:Successful placement of a right  percutaneous nephrostomy tube with ultrasound and fluoroscopic guidance.   Original Report Authenticated By: Richarda Overlie, M.D.   Ir US Guide Vasc Access Right  03/16/2013   *RADIOLOGY REPORT*  Clinical history:62 year old with right hydronephrosis due to a bladder mass.  The patient needs right kidney decompression.  PROCEDURE(S): ULTRASOUND AND FLUOROSCOPIC GUIDED RIGHT PERCUTANEOUS NEPHROSTOMY TUBE PLACEMENT  Physician: Rachelle Hora. Lowella Dandy, MD  Medications:Versed and Fentanyl.  A radiology nurse monitored the patient for moderate sedation.  Fluoroscopy time: 54 seconds  Contrast:  5 ml  Omnipaque-300  Procedure:Informed consent was obtained from the patient's wife because the patient was sedated earlier in the day.  The patient was placed prone.  Ultrasound confirmed moderate right hydronephrosis.  The right flank was prepped and draped in a sterile fashion.  Maximal barrier sterile technique was utilized including caps, mask, sterile gowns, sterile gloves, sterile drape, hand hygiene and skin antiseptic.  The skin was anesthetized with 1% lidocaine.  21 gauge needle was directed into a lower pole calix with ultrasound guidance.  Urine was identified and a wire was advanced into the renal collecting system.  An Accustick dilator set was placed.  Contrast was injected to confirm placement in the renal collecting system.  The tract was dilated and a 10-French multipurpose drain was placed within the renal pelvis.  The catheter was sutured to the skin.  A dressing was placed over the catheter site. Fluoroscopic and ultrasound images were taken and saved for documentation.  Findings:Moderate right hydronephrosis.  Placement of 10 French multipurpose drain within the right renal pelvis.  Complications: None  Impression:Successful placement of a right percutaneous nephrostomy tube with ultrasound and fluoroscopic guidance.   Original Report Authenticated By: Richarda Overlie, M.D.    Disposition: 01-Home or Self Care       Discharge Orders   Future Orders Complete By Expires   Discharge patient  As directed    Comments:     In AM   Discontinue IV  As directed    Urinary leg bag  As directed       Follow-up Information   Follow up with [name], MD. (for foley catheter remopval Monday : call Selena Batten [phone])    Specialty:  Urology   Contact information:   [address] [place] [address] [phone]        Signed: Anner Crete 03/18/2013, 9:29 AM

## 2013-03-18 NOTE — Progress Notes (Signed)
Discharge instructions explained using teach back, prescriptions given. Leg bag applied, return demo done, new overnight bag given. Stable for discharge.

## 2013-03-21 ENCOUNTER — Other Ambulatory Visit: Payer: Self-pay | Admitting: Oncology

## 2013-03-21 ENCOUNTER — Telehealth: Payer: Self-pay | Admitting: Oncology

## 2013-03-21 DIAGNOSIS — C679 Malignant neoplasm of bladder, unspecified: Secondary | ICD-10-CM

## 2013-03-21 NOTE — Telephone Encounter (Signed)
C/D 03/21/13 for appt. 03/22/13

## 2013-03-21 NOTE — Telephone Encounter (Signed)
S/W PT IN RE AND GAVE NP APPT 09/10 @ 10:30 W/DR. SHADAD REFERRING DR. TANNENBAUM  DX- INVASIVE BLADDER CA  WELCOME PACKET AT REGISTRATION

## 2013-03-22 ENCOUNTER — Ambulatory Visit: Payer: Commercial Managed Care - PPO

## 2013-03-22 ENCOUNTER — Other Ambulatory Visit: Payer: Commercial Managed Care - PPO | Admitting: Lab

## 2013-03-22 ENCOUNTER — Encounter: Payer: Self-pay | Admitting: Oncology

## 2013-03-22 ENCOUNTER — Ambulatory Visit (HOSPITAL_BASED_OUTPATIENT_CLINIC_OR_DEPARTMENT_OTHER): Payer: Commercial Managed Care - PPO | Admitting: Oncology

## 2013-03-22 ENCOUNTER — Telehealth: Payer: Self-pay | Admitting: Oncology

## 2013-03-22 VITALS — BP 107/66 | HR 65 | Temp 98.0°F | Ht 68.75 in | Wt 167.2 lb

## 2013-03-22 DIAGNOSIS — C679 Malignant neoplasm of bladder, unspecified: Secondary | ICD-10-CM

## 2013-03-22 NOTE — Progress Notes (Signed)
Please see consult note.  

## 2013-03-22 NOTE — Consult Note (Signed)
Reason for Referral: Bladder.   HPI: This is a pleasant 62 year old gentleman with past medical history significant for hypertension and hyperlipidemia referred to me for the evaluation of the invasive bladder cancer. He reports he has history of recurrent superficial bladder tumor for at least the last 25 years. 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 He had recurrence in 2009 also showed noninvasive tumor. He was also treated with a full-strength BCG/interferon in 2012. Most recently however he presented with hematuria and his workup by Dr. [name] included a CT scan of the abdomen and pelvis which showed a 5.1 cm right posterior lateral bladder mass with possible perivesicular extension and involvement of the distal right ureter. On 03/16/2013 he underwent cystourethroscopy and the resection of a 10 cm right lateral bladder wall tumor. 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 He also underwent right-sided percutaneous nephrostomy tube for kidney drainage. Patient tolerated these procedures well and was referred to me for the evaluation and further multidisciplinary treatment of his locally advanced bladder cancer.  Clinically, he feels relatively fair. Does report some occasional or abdominal and pelvic pain. Does report some occasional hematuria. He is not reporting any weight loss or appetite changes. [place] any change in his performance status and activity level. He is still able to work full time and continue to do so. He has not reported any chest pain or shortness of breath. Ster[place] any cough or hemoptysis or hematemesis. Sterapred any abdominal distention or early satiety. [place] any bleeding or thrombotic events. Did not have any neurological events.   Past Medical History   Diagnosis Date  . Hypertension   . Bladder mass   . Prostate enlargement   . Hyperlipidemia   . Headache(784.0)     hx. migraines  . Cancer     bladder cancer-hx. of past tumors  :  Past Surgical History  Procedure Laterality Date  . Bladder tumor excision      Multiple times , none in 10 yrs  . Transurethral resection of bladder tumor N/A 03/16/2013    Procedure: TRANSURETHRAL RESECTION OF BLADDER TUMOR (TURBT);  Surgeon: Kathi Ludwig, MD;  Location: [facility];  Service: Urology;  Laterality: N/A;  :  Current Outpatient Prescriptions  Medication Sig Dispense Refill  . amLODipine (NORVASC) 10 MG tablet Take 10 mg by mouth every morning.      Marland Kitchen aspirin EC 81 MG tablet Take 81 mg by mouth daily.      . carvedilol (COREG) 12.5 MG tablet Take 12.5 mg by mouth 2 (two) times daily with a meal.      . cholecalciferol (VITAMIN D) 1000 UNITS tablet Take 2,000 Units by mouth daily.      . ciprofloxacin (CIPRO) 500 MG tablet Take 1 tablet (500 mg total) by mouth 2 (two) times daily.  10 tablet  0  . fish oil-omega-3 fatty acids 1000 MG capsule Take 2 g by mouth daily.      . hydrochlorothiazide (HYDRODIURIL) 25 MG tablet Take 25 mg by mouth every morning.      . Meth-Hyo-M Bl-Na Phos-Ph Sal (URIBEL) 118 MG CAPS Take 1 capsule (118 mg total) by mouth 3 times/day as needed-between meals & bedtime.  120 capsule  3  . oxyCODONE-acetaminophen (ROXICET) 5-325 MG per tablet Take 1 tablet by mouth every 4 (four) hours  as needed for pain.  30 tablet  0  . potassium chloride SA (K-DUR,KLOR-CON) 20 MEQ tablet Take 20 mEq by mouth daily.      . pravastatin (PRAVACHOL) 20 MG tablet Take 20 mg by mouth every morning.      . vitamin C (ASCORBIC ACID) 500 MG tablet Take 500 mg by mouth daily.       No current facility-administered medications for this visit.       No Known Allergies:  No family history on file.:  History   Social History  . Marital Status: Married    Spouse Name: N/A     Number of Children: N/A  . Years of Education: N/A   Occupational History  . Not on file.   Social History Main Topics  . Smoking status: Current Every Day Smoker -- 0.50 packs/day    Types: Cigarettes  . Smokeless tobacco: Not on file  . Alcohol Use: Yes     Comment: occ to rare beer  . Drug Use: No  . Sexual Activity: Yes   Other Topics Concern  . Not on file   Social History Narrative  . No narrative on file  :  A comprehensive review of systems was negative.  Exam: ECOG 0 Blood pressure 107/66, pulse 65, temperature 98 F (36.7 C), temperature source Oral, height 5' 8.75" (1.746 m), weight 167 lb 4 oz (75.864 kg). General appearance: alert, cooperative and appears stated age Head: Normocephalic, without obvious abnormality, atraumatic Throat: lips, mucosa, and tongue normal; teeth and gums normal Neck: no adenopathy, no carotid bruit, no JVD, supple, symmetrical, trachea midline and thyroid not enlarged, symmetric, no tenderness/mass/nodules Back: negative Resp: clear to auscultation bilaterally Chest wall: no tenderness Cardio: regular rate and rhythm, S1, S2 normal, no murmur, click, rub or gallop GI: soft, non-tender; bowel sounds normal; no masses,  no organomegaly Extremities: extremities normal, atraumatic, no cyanosis or edema Pulses: 2+ and symmetric Skin: Skin color, texture, turgor normal. No rashes or lesions    Ir Perc Nephrostomy Right  03/16/2013   *RADIOLOGY REPORT*  Clinical history:62 year old with right hydronephrosis due to a bladder mass.  The patient needs right kidney decompression.  PROCEDURE(S): ULTRASOUND AND FLUOROSCOPIC GUIDED RIGHT PERCUTANEOUS NEPHROSTOMY TUBE PLACEMENT  Physician: Rachelle Hora. Lowella Dandy, MD  Medications:Versed and Fentanyl.  A radiology nurse monitored the patient for moderate sedation.  Fluoroscopy time: 54 seconds  Contrast:  5 ml Omnipaque-300  Procedure:Informed consent was obtained from the patient's wife because the patient was  sedated earlier in the day.  The patient was placed prone.  Ultrasound confirmed moderate right hydronephrosis.  The right flank was prepped and draped in a sterile fashion.  Maximal barrier sterile technique was utilized including caps, mask, sterile gowns, sterile gloves, sterile drape, hand hygiene and skin antiseptic.  The skin was anesthetized with 1% lidocaine.  21 gauge needle was directed into a lower pole calix with ultrasound guidance.  Urine was identified and a wire was advanced into the renal collecting system.  An Accustick dilator set was placed.  Contrast was injected to confirm placement in the renal collecting system.  The tract was dilated and a 10-French multipurpose drain was placed within the renal pelvis.  The catheter was sutured to the skin.  A dressing was placed over the catheter site. Fluoroscopic and ultrasound images were taken and saved for documentation.  Findings:Moderate right hydronephrosis.  Placement of 10 French multipurpose drain within the right renal pelvis.  Complications: None  Impression:Successful placement  of a right percutaneous nephrostomy tube with ultrasound and fluoroscopic guidance.   Original Report Authenticated By: Richarda Overlie, M.D.   Ir US Guide Vasc Access Right  03/16/2013   *RADIOLOGY REPORT*  Clinical history:62 year old with right hydronephrosis due to a bladder mass.  The patient needs right kidney decompression.  PROCEDURE(S): ULTRASOUND AND FLUOROSCOPIC GUIDED RIGHT PERCUTANEOUS NEPHROSTOMY TUBE PLACEMENT  Physician: Rachelle Hora. Lowella Dandy, MD  Medications:Versed and Fentanyl.  A radiology nurse monitored the patient for moderate sedation.  Fluoroscopy time: 54 seconds  Contrast:  5 ml Omnipaque-300  Procedure:Informed consent was obtained from the patient's wife because the patient was sedated earlier in the day.  The patient was placed prone.  Ultrasound confirmed moderate right hydronephrosis.  The right flank was prepped and draped in a sterile fashion.   Maximal barrier sterile technique was utilized including caps, mask, sterile gowns, sterile gloves, sterile drape, hand hygiene and skin antiseptic.  The skin was anesthetized with 1% lidocaine.  21 gauge needle was directed into a lower pole calix with ultrasound guidance.  Urine was identified and a wire was advanced into the renal collecting system.  An Accustick dilator set was placed.  Contrast was injected to confirm placement in the renal collecting system.  The tract was dilated and a 10-French multipurpose drain was placed within the renal pelvis.  The catheter was sutured to the skin.  A dressing was placed over the catheter site. Fluoroscopic and ultrasound images were taken and saved for documentation.  Findings:Moderate right hydronephrosis.  Placement of 10 French multipurpose drain within the right renal pelvis.  Complications: None  Impression:Successful placement of a right percutaneous nephrostomy tube with ultrasound and fluoroscopic guidance.   Original Report Authenticated By: Richarda Overlie, M.D.    Assessment and Plan:   62 year old gentleman with the following issues:  1. 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 He presented with a 5.1 cm of the right posterior lateral bladder wall and invasion into the perivesicular space and extension and involvement of the distal right ureter. This tumor at least represents a T4 disease without any lymphadenopathy that is visible on his imaging studies. There is no CT scan of the chest but a chest x-ray did not reveal an obvious pulmonary metastasis indicating no distant metastasis at this time. The natural course of bladder cancer in general and more specifically locally advanced disease was discussed with the patient today. I do agree with Dr. [name], at a multidisciplinary approach is warranted to treat his cancer. I feel that neoadjuvant chemotherapy would be reasonable in him followed by surgical resection and possibly  adjuvant radiation therapy afterwards. I feel that will offer him the best option to cure his cancer. I discussed the logistics of administration of neoadjuvant chemotherapy today as well as the complications associated with it. I've also discussed the risks and benefits of using the same combination of chemotherapy in the adjuvant setting in the challenges associated with that. I discussed with them the side effects associated with combination of cisplatin and Gemzar. Complications that includes nausea, vomiting, GI complications, myelosuppression, neuropathy and nephrotoxicity. Neutropenia and neutropenic sepsis also discussed and possible need for a Port-A-Cath for chemotherapy infusion.  For the time being, he will consider these options and he is meeting with Dr. Berneice Heinrich to discuss the logistics of surgery and he will make a decision after that. Should he elect to proceed with surgery first, although it's not ideal but I think reasonable option then we will evaluate him afterwards  for possible adjuvant chemotherapy.  2. Renal function: His creatinine is improving with percutaneous nephrostomy tube insertion with a creatinine dropping from 1.5-1.4 without urination difficulties.  3. Anemia: His hemoglobin around 11 and sees to be stable due to his hematuria.  4. Followup: I scheduled him a quick followup in the end of September to potentially start chemotherapy which we will cancel if he elected not to proceed with.

## 2013-03-22 NOTE — Progress Notes (Signed)
Checked in new patient with no financial issues. He wants mail and ph for communication.

## 2013-03-22 NOTE — Telephone Encounter (Signed)
gv adn printed appt sched and avs for pt for SEpt °

## 2013-03-27 ENCOUNTER — Ambulatory Visit (HOSPITAL_BASED_OUTPATIENT_CLINIC_OR_DEPARTMENT_OTHER): Admit: 2013-03-27 | Payer: Self-pay | Admitting: Urology

## 2013-03-27 ENCOUNTER — Encounter (HOSPITAL_BASED_OUTPATIENT_CLINIC_OR_DEPARTMENT_OTHER): Payer: Self-pay

## 2013-03-27 SURGERY — TURBT (TRANSURETHRAL RESECTION OF BLADDER TUMOR)
Anesthesia: General | Laterality: Right

## 2013-03-29 ENCOUNTER — Encounter: Payer: Self-pay | Admitting: Radiation Oncology

## 2013-03-29 DIAGNOSIS — C679 Malignant neoplasm of bladder, unspecified: Secondary | ICD-10-CM | POA: Insufficient documentation

## 2013-03-29 NOTE — Progress Notes (Addendum)
GU Location of Tumor / Histology: bladder, right lateral wall  If Prostate Cancer, Gleason Score is () and PSA is ()  NA  Patient presented 5 months ago with signs/symptoms of: hematuria, clots, urgency  Biopsies of bladder (if applicable) revealed:  03/16/13 Bladder, transurethral resection, right lateral wall, 10cm - INVASIVE HIGH GRADE UROTHELIAL CARCINOMA, INVADING INTO THE MUSCULARIS PROPRIA. - ANGIOLYMPHATIC INVASION PRESENT.  Past/Anticipated interventions by urology, if any: BCG, interferon , last treatment 2/24/122012  Past/Anticipated interventions by medical oncology, if any: treated w/BCG bladder wash chemo in past  Weight changes, if any: recent 8 lb weight loss  Bowel/Bladder complaints, if any: occasional hematuria but none in past week, constipation in past few weeks. Pt has leg bag draining clear urine from right kidney nephrostomy tube. Pt denies other urinary issues, problems.  Nausea/Vomiting, if any: no  Pain issues, if any:  none  SAFETY ISSUES:  Prior radiation? no  Pacemaker/ICD? no  Possible current pregnancy? na  Is the patient on methotrexate? 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 Pt denies pain, fatigue, loss of appetite. Advised stool softeners for constipation. He circled 9/10 on distress screening; informed him that this RN will call SW who will see him today. If SW not available today, pt will be contacted by phone. Pt and wife verbalized understanding. Pt stated "I feel fine except for the stress of this situation." Left vm for L Mullis SW re: pt's distress score. Requested she call pt today if she is unable to see him while he is here.

## 2013-03-30 ENCOUNTER — Encounter: Payer: Self-pay | Admitting: Radiation Oncology

## 2013-03-30 ENCOUNTER — Ambulatory Visit
Admission: RE | Admit: 2013-03-30 | Discharge: 2013-03-30 | Disposition: A | Discharge status: Home / Self Care | Payer: Commercial Managed Care - PPO | Source: Ambulatory Visit | Attending: Radiation Oncology | Admitting: Radiation Oncology

## 2013-03-30 VITALS — BP 99/65 | HR 63 | Temp 98.6°F | Resp 20 | Wt 166.9 lb

## 2013-03-30 DIAGNOSIS — C679 Malignant neoplasm of bladder, unspecified: Secondary | ICD-10-CM

## 2013-03-30 DIAGNOSIS — I10 Essential (primary) hypertension: Secondary | ICD-10-CM | POA: Insufficient documentation

## 2013-03-30 DIAGNOSIS — E785 Hyperlipidemia, unspecified: Secondary | ICD-10-CM | POA: Insufficient documentation

## 2013-03-30 HISTORY — DX: Malignant neoplasm of bladder, unspecified: C67.9

## 2013-03-30 NOTE — Progress Notes (Signed)
[facility] Radiation Oncology NEW PATIENT EVALUATION  Name: Kenneth Lawrence MRN: [id number]  Date:   03/30/2013           DOB: [birth year]  Status: outpatient   CC: Benita Stabile, MD  Kathi Ludwig, * Dr. Eli Hose, Dr. 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 male who is seen today for the courtesy Dr. 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 He has a long-standing history of superficial bladder carcinomas, treated with BCG in the past. He had a recurrence in 2009 which showed noninvasive tumor. He underwent intravesical BCG/interferon in 2012. More recently, he developed gross hematuria and a CT scan on 02/16/2013 showed a 5.1 x 3.2 x 4.9 cm mass along the right posterior lateral bladder wall with perivesical extension and involvement of the distal right ureter. There was no suspicious adenopathy. On 03/16/2013 he underwent cystoscopy and TURB of a 10 cm right lateral bladder wall mass. There is no ureteral orifice identifiable. Biopsies were diagnostic for invasive high-grade urothelial carcinoma invading into muscularis propria. LV I. was present. A nephrostomy tube was placed to preserve his right kidney function. He is scheduled see Dr. Berneice Heinrich in one week for discussion of cystectomy. He was seen Dr. Clelia Croft who feels that he would be a candidate for neoadjuvant chemotherapy. He is lost approximately 10 pounds over the past 3 months, some of which has been intentional. His appetite is poor to fair.  PREVIOUS RADIATION THERAPY: No   PAST MEDICAL HISTORY:  has a past medical history of Hypertension; Bladder mass; Prostate enlargement; Hyperlipidemia; Headache(784.0); Cancer; and Bladder cancer.     PAST SURGICAL HISTORY:  Past  Surgical History  Procedure Laterality Date  . Bladder tumor excision      Multiple times , none in 10 yrs  . Transurethral resection of bladder tumor N/A 03/16/2013    Procedure: TRANSURETHRAL RESECTION OF BLADDER TUMOR (TURBT);  Surgeon: Kathi Ludwig, MD;  Location: [facility];  Service: Urology;  Laterality: N/A;     FAMILY HISTORY: family history includes Cancer in his father, mother, and sister; Heart disease in his father; Prostate cancer in his father; Stroke in his father. His father died from cardiac disease and 104. His mother died from metastatic breast cancer at 11.   SOCIAL HISTORY:  reports that he quit smoking about 2 weeks ago. His smoking use included Cigarettes. He smoked 0.50 packs per day. He does not have any smokeless tobacco history on file. He reports that  drinks alcohol. He reports that he does not use illicit drugs. Married, 2 children, a son 64, and daughter 11. 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   ALLERGIES: Review of patient's allergies indicates no known allergies.   MEDICATIONS:  Current Outpatient Prescriptions  Medication Sig Dispense Refill  . amLODipine (NORVASC) 10 MG tablet Take 10 mg by mouth every morning.      Marland Kitchen aspirin EC 81 MG tablet Take 81 mg by mouth daily.      . carvedilol (COREG) 12.5 MG tablet Take 12.5 mg by mouth 2 (two) times daily with a meal.      . cholecalciferol (VITAMIN D) 1000 UNITS tablet Take 2,000 Units by mouth daily.      Marland Kitchen  fish oil-omega-3 fatty acids 1000 MG capsule Take 2 g by mouth daily.      . hydrochlorothiazide (HYDRODIURIL) 25 MG tablet Take 25 mg by mouth every morning.      . Meth-Hyo-M Bl-Na Phos-Ph Sal (URIBEL) 118 MG CAPS Take 1 capsule (118 mg total) by mouth 3 times/day as needed-between meals & bedtime.  120 capsule  3  . oxyCODONE-acetaminophen (ROXICET) 5-325 MG per tablet Take 1 tablet by mouth every 4 (four) hours as needed for pain.  30 tablet  0  . potassium chloride SA (K-DUR,KLOR-CON) 20  MEQ tablet Take 20 mEq by mouth daily.      . pravastatin (PRAVACHOL) 20 MG tablet Take 20 mg by mouth every morning.      . vitamin C (ASCORBIC ACID) 500 MG tablet Take 500 mg by mouth daily.       No current facility-administered medications for this encounter.     REVIEW OF SYSTEMS:  Pertinent items are noted in HPI.    PHYSICAL EXAM:  weight is 166 lb 14.4 oz (75.705 kg). His oral temperature is 98.6 F (37 C). His blood pressure is 99/65 and his pulse is 63. His respiration is 20.   Alert and oriented. Head and neck examination: Grossly unremarkable. Nodes: Without palpable cervical, or supraclavicular lymphadenopathy. Chest: Lungs clear. Heart: Regular in rhythm. Back: Without spinal or CVA tenderness. Abdomen soft without masses organomegaly. Rectal examination not performed. Extremities: Without edema.   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 I explained to the patient and his wife that surgery is clearly the treatment of choice in terms of local control and survival. Neoadjuvant chemotherapy may improve resectability in overall survival. Radiation therapy is reserved for patients were not felt to be resectable, or are medically inoperable. The patient will see Dr. Berneice Heinrich in one week, and he'll have a conversation with  Dr. Clelia Croft regarding neoadjuvant chemotherapy. Radiation therapy may be offered postoperatively depending on surgical findings, but has only shown to improve local control, not overall survival. We discussed the potential acute and late toxicities of radiation therapy if  indicated.   PLAN: As discussed above.  I spent 45 minutes minutes face to face with the patient and more than 50% of that time was spent in counseling and/or coordination of care.

## 2013-03-30 NOTE — Addendum Note (Signed)
Encounter addended by: Glennie Hawk, RN on: 03/30/2013  9:46 AM<BR>     Documentation filed: Charges VN

## 2013-03-30 NOTE — Progress Notes (Signed)
Please see the Nurse Progress Note in the MD Initial Consult Encounter for this patient. 

## 2013-03-30 NOTE — Addendum Note (Signed)
Encounter addended by: Glennie Hawk, RN on: 03/30/2013 10:33 AM<BR>     Documentation filed: Inpatient Document Flowsheet

## 2013-04-07 ENCOUNTER — Ambulatory Visit (HOSPITAL_BASED_OUTPATIENT_CLINIC_OR_DEPARTMENT_OTHER): Payer: Commercial Managed Care - PPO | Admitting: Oncology

## 2013-04-07 ENCOUNTER — Other Ambulatory Visit (HOSPITAL_BASED_OUTPATIENT_CLINIC_OR_DEPARTMENT_OTHER): Payer: Commercial Managed Care - PPO | Admitting: Lab

## 2013-04-07 VITALS — BP 121/60 | HR 62 | Temp 98.0°F | Resp 20 | Ht 68.75 in | Wt 166.9 lb

## 2013-04-07 DIAGNOSIS — C679 Malignant neoplasm of bladder, unspecified: Secondary | ICD-10-CM

## 2013-04-07 LAB — COMPREHENSIVE METABOLIC PANEL (CC13)
Albumin: 3.4 g/dL — ABNORMAL LOW (ref 3.5–5.0)
BUN: 14.7 mg/dL (ref 7.0–26.0)
Calcium: 9.5 mg/dL (ref 8.4–10.4)
Chloride: 103 mEq/L (ref 98–109)
Glucose: 113 mg/dl (ref 70–140)
Potassium: 3.3 mEq/L — ABNORMAL LOW (ref 3.5–5.1)

## 2013-04-07 LAB — CBC WITH DIFFERENTIAL/PLATELET
Basophils Absolute: 0.1 10*3/uL (ref 0.0–0.1)
Eosinophils Absolute: 0.6 10*3/uL — ABNORMAL HIGH (ref 0.0–0.5)
HGB: 10.3 g/dL — ABNORMAL LOW (ref 13.0–17.1)
MCV: 80.2 fL (ref 79.3–98.0)
NEUT#: 3.9 10*3/uL (ref 1.5–6.5)
RDW: 14.4 % (ref 11.0–14.6)
lymph#: 2.2 10*3/uL (ref 0.9–3.3)

## 2013-04-07 NOTE — Progress Notes (Signed)
Hematology and Oncology Follow Up Visit  Kenneth Lawrence [id number] 06-Mar-1951 62 y.o. 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 He has a 5.1 cm of the right posterior bladder wall and invasion into the perivesicularwith clinical staging T4 N0 disease.   Prior Therapy: On 03/16/2013 he underwent cystourethroscopy and the resection of a 10 cm right lateral bladder wall tumor.   Current therapy: He is under evaluation for neoadjuvant chemotherapy.  Interim History:  Kenneth Lawrence presents today for a followup visit. He is a pleasant gentleman with the above history returns today to discuss neoadjuvant chemotherapy. Since his last visit, he was evaluated by Dr. Berneice Heinrich for surgical resection he also was evaluated by Dr. Dayton Scrape for possible radiation therapy. Clinically he is unchanged. He is not reporting any nausea or vomiting. Is not reporting any hematuria or flank pain. He does report some pelvic pressure but no major changes in his appetite or performance status. He still have a percutaneous nephrostomy tube in place.  Medications: I have reviewed the patient's current medications.  Current Outpatient Prescriptions  Medication Sig Dispense Refill  . amLODipine (NORVASC) 10 MG tablet Take 10 mg by mouth every morning.      Marland Kitchen aspirin EC 81 MG tablet Take 81 mg by mouth daily.      . carvedilol (COREG) 12.5 MG tablet Take 12.5 mg by mouth 2 (two) times daily with a meal.      . cholecalciferol (VITAMIN D) 1000 UNITS tablet Take 2,000 Units by mouth daily.      . fish oil-omega-3 fatty acids 1000 MG capsule Take 2 g by mouth daily.      . hydrochlorothiazide (HYDRODIURIL) 25 MG tablet Take 25 mg by mouth every morning.      . Meth-Hyo-M Bl-Na Phos-Ph Sal (URIBEL) 118 MG CAPS Take 1 capsule (118 mg total) by mouth 3 times/day as needed-between meals & bedtime.   120 capsule  3  . oxyCODONE-acetaminophen (ROXICET) 5-325 MG per tablet Take 1 tablet by mouth every 4 (four) hours as needed for pain.  30 tablet  0  . potassium chloride SA (K-DUR,KLOR-CON) 20 MEQ tablet Take 20 mEq by mouth daily.      . pravastatin (PRAVACHOL) 20 MG tablet Take 20 mg by mouth every morning.      . vitamin C (ASCORBIC ACID) 500 MG tablet Take 500 mg by mouth daily.       No current facility-administered medications for this visit.     Allergies: No Known Allergies  Past Medical History, Surgical history, Social history, and Family History were reviewed and updated.  Review of Systems: Constitutional:  Negative for fever, chills, night sweats, anorexia, weight loss, pain. Cardiovascular: no chest pain or dyspnea on exertion Respiratory: negative Neurological: negative Dermatological: negative ENT: negative Skin: Negative. Gastrointestinal: no abdominal pain, change in bowel habits, or black or bloody stools Genito-Urinary: negative Hematological and Lymphatic: negative Breast: negative Musculoskeletal: negative Remaining ROS negative. Physical Exam: Blood pressure 121/60, pulse 62, temperature 98 F (36.7 C), temperature source Oral, resp. rate 20, height 5' 8.75" (1.746 m), weight 166 lb 14.4 oz (75.705 kg). ECOG: 1 General appearance: alert, cooperative and appears stated age Head: Normocephalic, without obvious abnormality, atraumatic Neck: no adenopathy, no carotid bruit, no JVD, supple, symmetrical, trachea midline and thyroid not enlarged, symmetric, no tenderness/mass/nodules Lymph nodes: Cervical, supraclavicular, and axillary nodes normal. Heart:regular rate and  rhythm, S1, S2 normal, no murmur, click, rub or gallop Lung:chest clear, no wheezing, rales, normal symmetric air entry Abdomin: soft, non-tender, without masses or organomegaly EXT:no erythema, induration, or nodules   Lab Results: Lab Results  Component Value Date   WBC 7.4 04/07/2013    HGB 10.3* 04/07/2013   HCT 31.6* 04/07/2013   MCV 80.2 04/07/2013   PLT 270 04/07/2013     Chemistry      Component Value Date/Time   NA 137 03/17/2013 0435   K 3.8 03/17/2013 0435   CL 100 03/17/2013 0435   CO2 30 03/17/2013 0435   BUN 12 03/17/2013 0435   CREATININE 1.48* 03/17/2013 0435      Component Value Date/Time   CALCIUM 9.2 03/17/2013 0435         Impression and Plan:   62 year old gentleman with the following issues:  1. 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 After a multidisciplinary discussion including input from radiation oncology as well as urology the consensus is to proceed with neoadjuvant chemotherapy. I discussed today the logistics of the administration of Gemzar and cisplatin for a total of 3-4 cycles. Complications that includes nausea, vomiting, myelosuppression, peripheral neuropathy, nephrotoxicity as well as infusion-related toxicity was discussed today in detail. He is leading to proceed with this treatment but would like to discuss it further with his wife before proceeding. Once he makes up his mind he will contact us and we will set him up to start after a chemotherapy education class.  2. IV access: I discussed with him the need to have a Port-A-Cath placed. Complications associated with that that includes bleeding, thrombosis, infection and pain was discussed and he is agreeable to proceed once he decides on chemotherapy.  3. Acute renal failure: That have resolved as a percutaneous nephrostomy tube and we are checking his kidney function again today.  [facility], MD 9/26/201410:51 AM

## 2013-04-12 NOTE — Progress Notes (Signed)
[facility] Psychosocial Distress Screening Clinical Social Work  Clinical Social Work Intern was referred by distress screening protocol.  The patient scored a 9 on the Psychosocial Distress Thermometer which indicates severe distress. Clinical Social Worker Intern telephoned to assess for distress and other psychosocial needs. Patient did not answer phone.  Clinical Social Worker Intern left message in regards to returning call to discuss distress screening.   Clinical Social Worker follow up needed: no  If yes, follow up plan:   Laurie Lovejoy S. [facility] Clinical Social Work Intern [employer] [facility] [phone]

## 2013-04-17 ENCOUNTER — Telehealth: Payer: Self-pay | Admitting: *Deleted

## 2013-04-17 NOTE — Telephone Encounter (Signed)
Patient's wife called and left message regarding appts. Per wife " We are to sign up for a video class and port." Message forwarded to the desk RN.

## 2013-04-17 NOTE — Telephone Encounter (Signed)
Message received from [place], [place] scheduler pt requesting to set up port placement/chemo class. Call to pt to confirm, pt verbalized he would like to proceed with chemotherapy. Pt requested his treatment be on as Friday and MD is aware of this request. Reviewed with pt he will need port placed and chemo class, verbalized understanding. Pt states " Do I need chemotherapy to survive?" pt also verablized it's okay to speak to his wife and he has signed Hipp[facility], signed on 9/18. Message given to MD.

## 2013-04-18 ENCOUNTER — Other Ambulatory Visit: Payer: Self-pay | Admitting: Oncology

## 2013-04-18 ENCOUNTER — Telehealth: Payer: Self-pay | Admitting: Oncology

## 2013-04-18 ENCOUNTER — Other Ambulatory Visit: Payer: Self-pay | Admitting: *Deleted

## 2013-04-18 DIAGNOSIS — C679 Malignant neoplasm of bladder, unspecified: Secondary | ICD-10-CM

## 2013-04-18 MED ORDER — ONDANSETRON HCL 8 MG PO TABS: 8.0000 mg | ORAL_TABLET | Freq: Three times a day (TID) | ORAL | Status: DC | PRN

## 2013-04-18 MED ORDER — LIDOCAINE-PRILOCAINE 2.5-2.5 % EX CREA: TOPICAL_CREAM | CUTANEOUS | Status: DC | PRN

## 2013-04-18 NOTE — Telephone Encounter (Signed)
S/w pt re next appts for 10/9 and 10/17. Pt will get schedule when he comes in 10/9.

## 2013-04-18 NOTE — Telephone Encounter (Signed)
Per MD, emla cream and zofran to pt's pharmacy. Pt notified. Port placement scheduled for Monday 10/13. Chemo 10/17 Chemo class to be scheduled prior to 10/17. POF sent on 10/6

## 2013-04-19 ENCOUNTER — Other Ambulatory Visit: Payer: Self-pay | Admitting: Radiology

## 2013-04-20 ENCOUNTER — Encounter: Payer: Self-pay | Admitting: *Deleted

## 2013-04-20 ENCOUNTER — Encounter: Payer: Self-pay | Admitting: Interventional Cardiology

## 2013-04-20 ENCOUNTER — Other Ambulatory Visit: Payer: Commercial Managed Care - PPO

## 2013-04-21 ENCOUNTER — Other Ambulatory Visit: Payer: Self-pay | Admitting: Radiology

## 2013-04-21 ENCOUNTER — Encounter (HOSPITAL_COMMUNITY): Payer: Self-pay | Admitting: Pharmacy Technician

## 2013-04-21 ENCOUNTER — Encounter: Payer: Self-pay | Admitting: *Deleted

## 2013-04-21 NOTE — Progress Notes (Signed)
[facility] Psychosocial Distress Screening Clinical Social Work  Clinical Social Work was referred by distress screening protocol.  The patient scored a 7 on the Psychosocial Distress Thermometer which indicates moderate distress. Clinical Social Worker phoned to assess for distress and other psychosocial needs. Pt reports he is still somewhat distressed as his mother died from cancer. He received info about the support programs at chemo class and he plans to attend them CSW also discussed CSW supports as well. Pt reports to have good support, but was very appreciative of the call.   Clinical Social Worker follow up needed: no  Doreen Salvage, LCSW Clinical Social Worker [facility] Family Support [facility] Wednesday, Thursday and Friday Phone: [phone] Fax: [phone]

## 2013-04-24 ENCOUNTER — Ambulatory Visit (HOSPITAL_COMMUNITY)
Admission: RE | Admit: 2013-04-24 | Discharge: 2013-04-24 | Disposition: A | Discharge status: Home / Self Care | Payer: Commercial Managed Care - PPO | Source: Ambulatory Visit | Attending: Oncology | Admitting: Oncology

## 2013-04-24 ENCOUNTER — Other Ambulatory Visit: Payer: Self-pay | Admitting: Oncology

## 2013-04-24 ENCOUNTER — Encounter (HOSPITAL_COMMUNITY): Payer: Self-pay

## 2013-04-24 DIAGNOSIS — C679 Malignant neoplasm of bladder, unspecified: Secondary | ICD-10-CM | POA: Insufficient documentation

## 2013-04-24 DIAGNOSIS — G43909 Migraine, unspecified, not intractable, without status migrainosus: Secondary | ICD-10-CM | POA: Insufficient documentation

## 2013-04-24 DIAGNOSIS — Z79899 Other long term (current) drug therapy: Secondary | ICD-10-CM | POA: Insufficient documentation

## 2013-04-24 DIAGNOSIS — I10 Essential (primary) hypertension: Secondary | ICD-10-CM | POA: Insufficient documentation

## 2013-04-24 DIAGNOSIS — Z87891 Personal history of nicotine dependence: Secondary | ICD-10-CM | POA: Insufficient documentation

## 2013-04-24 DIAGNOSIS — E785 Hyperlipidemia, unspecified: Secondary | ICD-10-CM | POA: Insufficient documentation

## 2013-04-24 LAB — CBC
MCH: 25.8 pg — ABNORMAL LOW (ref 26.0–34.0)
Platelets: 247 10*3/uL (ref 150–400)
RBC: 4.5 MIL/uL (ref 4.22–5.81)
WBC: 6.1 10*3/uL (ref 4.0–10.5)

## 2013-04-24 LAB — PROTIME-INR: Prothrombin Time: 12.9 seconds (ref 11.6–15.2)

## 2013-04-24 MED ORDER — CEFAZOLIN SODIUM-DEXTROSE 2-3 GM-% IV SOLR
INTRAVENOUS | Status: AC
  Administered 2013-04-24: 2 g via INTRAVENOUS
  Filled 2013-04-24: qty 50

## 2013-04-24 MED ORDER — FENTANYL CITRATE 0.05 MG/ML IJ SOLN
INTRAMUSCULAR | Status: AC | PRN
  Administered 2013-04-24: 100 ug via INTRAVENOUS

## 2013-04-24 MED ORDER — SODIUM CHLORIDE 0.9 % IV SOLN: Freq: Once | INTRAVENOUS | Status: DC

## 2013-04-24 MED ORDER — MIDAZOLAM HCL 2 MG/2ML IJ SOLN
INTRAMUSCULAR | Status: AC | PRN
  Administered 2013-04-24 (×2): 1 mg via INTRAVENOUS

## 2013-04-24 MED ORDER — HEPARIN SOD (PORK) LOCK FLUSH 100 UNIT/ML IV SOLN
INTRAVENOUS | Status: AC | PRN
  Administered 2013-04-24: 500 [IU]

## 2013-04-24 MED ORDER — MIDAZOLAM HCL 2 MG/2ML IJ SOLN
INTRAMUSCULAR | Status: AC
  Filled 2013-04-24: qty 6

## 2013-04-24 MED ORDER — LIDOCAINE-EPINEPHRINE (PF) 2 %-1:200000 IJ SOLN
INTRAMUSCULAR | Status: AC
  Filled 2013-04-24: qty 20

## 2013-04-24 MED ORDER — FENTANYL CITRATE 0.05 MG/ML IJ SOLN
INTRAMUSCULAR | Status: AC
  Filled 2013-04-24: qty 6

## 2013-04-24 MED ORDER — LIDOCAINE HCL 1 % IJ SOLN
INTRAMUSCULAR | Status: AC
  Filled 2013-04-24: qty 20

## 2013-04-24 MED ORDER — CEFAZOLIN SODIUM-DEXTROSE 2-3 GM-% IV SOLR
2.0000 g | Freq: Once | INTRAVENOUS | Status: AC
  Administered 2013-04-24: 2 g via INTRAVENOUS

## 2013-04-24 NOTE — Procedures (Signed)
Interventional Radiology Procedure Note  Procedure: Placement of a right IJ approach single lumen PowerPort.  Tip is positioned at the superior cavoatrial junction and catheter is ready for immediate use.  Complications: No immediate Recommendations:  - Ok to shower tomorrow - Do not submerge for 7 days - Routine line care   Signed,  Krista Godsil K. Ashely Joshua, MD [facility]   

## 2013-04-24 NOTE — H&P (Signed)
Kenneth Lawrence is an 62 y.o. male.   Chief Complaint: "I'm getting a port placed" HPI: Patient with history of locally advanced transitional cell carcinoma of bladder presents today for port a cath placement for chemotherapy.   Past Medical History  Diagnosis Date  . Hypertension   . Bladder mass   . Prostate enlargement   . Hyperlipidemia   . Headache(784.0)     hx. migraines  . Cancer     bladder cancer-hx. of past tumors  . Bladder cancer     25 yr history  . LV dysfunction   . Erectile dysfunction     Past Surgical History  Procedure Laterality Date  . Bladder tumor excision      Multiple times , none in 10 yrs  . Transurethral resection of bladder tumor N/A 03/16/2013    Procedure: TRANSURETHRAL RESECTION OF BLADDER TUMOR (TURBT);  Surgeon: Kathi Ludwig, MD;  Location: [facility];  Service: Urology;  Laterality: N/A;    Family History  Problem Relation Age of Onset  . Cancer Mother     breast  . Cancer Father     brain  . Heart disease Father   . Stroke Father   . Prostate cancer Father   . Cancer Sister     breast in 1 sister   Social History:  reports that he quit smoking about 5 weeks ago. His smoking use included Cigarettes. He smoked 0.50 packs per day. He does not have any smokeless tobacco history on file. He reports that he drinks alcohol. He reports that he does not use illicit drugs.  Allergies:  Allergies  Allergen Reactions  . Ace Inhibitors     Angioedema   patient denies     Current outpatient prescriptions:amLODipine (NORVASC) 10 MG tablet, Take 10 mg by mouth every morning., Disp: , Rfl: ;  aspirin EC 81 MG tablet, Take 81 mg by mouth every morning. , Disp: , Rfl: ;  carvedilol (COREG) 12.5 MG tablet, Take 12.5 mg by mouth 2 (two) times daily with a meal., Disp: , Rfl: ;  cholecalciferol (VITAMIN D) 1000 UNITS tablet, Take 2,000 Units by mouth daily., Disp: , Rfl:  fish oil-omega-3 fatty acids 1000 MG capsule, Take 2 g by mouth daily., Disp:  , Rfl: ;  hydrochlorothiazide (HYDRODIURIL) 25 MG tablet, Take 25 mg by mouth every morning., Disp: , Rfl: ;  Meth-Hyo-M Bl-Na Phos-Ph Sal (URIBEL) 118 MG CAPS, Take 1 capsule by mouth 3 (three) times daily., Disp: , Rfl: ;  potassium chloride SA (K-DUR,KLOR-CON) 20 MEQ tablet, Take 20 mEq by mouth daily., Disp: , Rfl:  pravastatin (PRAVACHOL) 20 MG tablet, Take 20 mg by mouth every morning., Disp: , Rfl: ;  vitamin C (ASCORBIC ACID) 500 MG tablet, Take 500 mg by mouth daily., Disp: , Rfl: ;  lidocaine-prilocaine (EMLA) cream, Apply 1 application topically as needed., Disp: , Rfl: ;  ondansetron (ZOFRAN) 8 MG tablet, Take 8 mg by mouth every 8 (eight) hours as needed for nausea., Disp: , Rfl:  Current facility-administered medications:0.9 %  sodium chloride infusion, , Intravenous, Once, Koreen D Morgan, PA-C;  ceFAZolin (ANCEF) IVPB 2 g/50 mL premix, 2 g, Intravenous, Once, Berneta Levins, PA-C   Results for orders placed during the hospital encounter of 04/24/13 (from the past 48 hour(s))  APTT     Status: None   Collection Time    04/24/13 12:05 PM      Result Value Range   aPTT 33  24 - 37 seconds  CBC     Status: Abnormal   Collection Time    04/24/13 12:05 PM      Result Value Range   WBC 6.1  4.0 - 10.5 K/uL   RBC 4.50  4.22 - 5.81 MIL/uL   Hemoglobin 11.6 (*) 13.0 - 17.0 g/dL   HCT 16.1 (*) 09.6 - 04.5 %   MCV 78.9  78.0 - 100.0 fL   MCH 25.8 (*) 26.0 - 34.0 pg   MCHC 32.7  30.0 - 36.0 g/dL   RDW 40.9  81.1 - 91.4 %   Platelets 247  150 - 400 K/uL  PROTIME-INR     Status: None   Collection Time    04/24/13 12:05 PM      Result Value Range   Prothrombin Time 12.9  11.6 - 15.2 seconds   INR 0.99  0.00 - 1.49   No results found.  Review of Systems  Constitutional: Negative for fever and chills.  HENT:       Occ migraine HA's  Respiratory: Negative for cough and shortness of breath.   Cardiovascular: Negative for chest pain.  Gastrointestinal: Negative for nausea, vomiting  and abdominal pain.  Musculoskeletal: Negative for back pain.  Endo/Heme/Allergies: Does not bruise/bleed easily.   Vitals: BP 130/67   HR  69  R 16  TEMP 98.5  O2 SATS 100% RA Physical Exam  Constitutional: He is oriented to person, place, and time. He appears well-developed and well-nourished.  Cardiovascular: Normal rate and regular rhythm.   Respiratory: Effort normal and breath sounds normal.  GI: Soft. Bowel sounds are normal. There is no tenderness.  Genitourinary:  Intact rt PCN  Musculoskeletal: Normal range of motion. He exhibits no edema.  Neurological: He is alert and oriented to person, place, and time.     Assessment/Plan Pt with hx of TCC bladder. Plan is for port a cath placement today for chemotherapy. Details/risks of procedure d/w pt /wife with their understanding and consent.  ALLRED,D KEVIN 04/24/2013, 12:28 PM

## 2013-04-24 NOTE — Progress Notes (Signed)
Rt nephrostomy tube dressing changed.

## 2013-04-25 ENCOUNTER — Encounter: Payer: Self-pay | Admitting: Interventional Cardiology

## 2013-04-25 ENCOUNTER — Ambulatory Visit (INDEPENDENT_AMBULATORY_CARE_PROVIDER_SITE_OTHER): Payer: Commercial Managed Care - PPO | Admitting: Interventional Cardiology

## 2013-04-25 ENCOUNTER — Other Ambulatory Visit: Payer: Self-pay | Admitting: Pharmacist

## 2013-04-25 VITALS — BP 122/72 | HR 61 | Ht 69.0 in | Wt 168.0 lb

## 2013-04-25 DIAGNOSIS — R9430 Abnormal result of cardiovascular function study, unspecified: Secondary | ICD-10-CM | POA: Insufficient documentation

## 2013-04-25 DIAGNOSIS — I519 Heart disease, unspecified: Secondary | ICD-10-CM | POA: Insufficient documentation

## 2013-04-25 DIAGNOSIS — I429 Cardiomyopathy, unspecified: Secondary | ICD-10-CM | POA: Insufficient documentation

## 2013-04-25 DIAGNOSIS — I10 Essential (primary) hypertension: Secondary | ICD-10-CM | POA: Insufficient documentation

## 2013-04-25 DIAGNOSIS — C679 Malignant neoplasm of bladder, unspecified: Secondary | ICD-10-CM

## 2013-04-25 DIAGNOSIS — E782 Mixed hyperlipidemia: Secondary | ICD-10-CM

## 2013-04-25 DIAGNOSIS — I428 Other cardiomyopathies: Secondary | ICD-10-CM | POA: Insufficient documentation

## 2013-04-25 NOTE — Progress Notes (Signed)
Patient ID: Kenneth Lawrence, male   DOB: Jun 10, 1951, 62 y.o.   MRN: [id number]    [address] [place], [place]  [address] Phone: [phone] Fax:  [phone]  Date:  04/25/2013   ID:  Kenneth Lawrence, DOB April 06, 1951, MRN [id number]  PCP:  [name], MD      History of Present Illness: Kenneth Lawrence is a 62 y.o. male  with cardiomyopathy and presumed coronary artery disease due to abnormal stress test, along with the fact that his son has CAD. Since I last saw him, he has been diagnosed with bladder cancer. He is starting chemotherapy  In th enext few days.   Walks fairly regularly (2-3x/week), about 20-30 minutes each time. He has stopped smoking cigarettes. Cardiomyopathy:  c/o Dizziness while getting up from sitting position.  Denies : Chest pain.  Shortness of breath.  Leg edema.  Orthopnea.  Paroxysmal nocturnal dyspnea.  Palpitations.  Syncope.     Wt Readings from Last 3 Encounters:  04/25/13 168 lb (76.204 kg)  04/24/13 168 lb (76.204 kg)  04/07/13 166 lb 14.4 oz (75.705 kg)     Past Medical History  Diagnosis Date  . Hypertension   . Bladder mass   . Prostate enlargement   . Hyperlipidemia   . Headache(784.0)     hx. migraines  . Cancer     bladder cancer-hx. of past tumors  . Bladder cancer     25 yr history  . LV dysfunction     resolved by 2012 stress test. EF 52%  . Erectile dysfunction   . Nonspecific abnormal unspecified cardiovascular function study     EF 52% in 10/12. Basal inferior wall defect. 7:45 on treadmill.    Current Outpatient Prescriptions  Medication Sig Dispense Refill  . amLODipine (NORVASC) 10 MG tablet Take 10 mg by mouth every morning.      Marland Kitchen aspirin EC 81 MG tablet Take 81 mg by mouth every morning.       . carvedilol (COREG) 12.5 MG tablet Take 12.5 mg by mouth 2 (two) times daily with a meal.      . cholecalciferol (VITAMIN D) 1000 UNITS tablet Take 2,000 Units by mouth daily.      . fish oil-omega-3 fatty  acids 1000 MG capsule Take 2 g by mouth daily.      . hydrochlorothiazide (HYDRODIURIL) 25 MG tablet Take 25 mg by mouth every morning.      . lidocaine-prilocaine (EMLA) cream Apply 1 application topically as needed.      . Meth-Hyo-M Bl-Na Phos-Ph Sal (URIBEL) 118 MG CAPS Take 1 capsule by mouth 3 (three) times daily.      . ondansetron (ZOFRAN) 8 MG tablet Take 8 mg by mouth every 8 (eight) hours as needed for nausea.      . potassium chloride SA (K-DUR,KLOR-CON) 20 MEQ tablet Take 20 mEq by mouth daily.      . pravastatin (PRAVACHOL) 20 MG tablet Take 20 mg by mouth every morning.      . vitamin C (ASCORBIC ACID) 500 MG tablet Take 500 mg by mouth daily.       No current facility-administered medications for this visit.    Allergies:    No Known Allergies  Social History:  The patient  reports that he quit smoking about 5 weeks ago. His smoking use included Cigarettes. He smoked 0.50 packs per day. He does not have any smokeless tobacco history on file. He  reports that he drinks alcohol. He reports that he does not use illicit drugs.   Family History:  The patient's family history includes Cancer in his father, mother, and sister; Heart disease in his father; Prostate cancer in his father; Stroke in his father.   ROS:  Please see the history of present illness.  No nausea, vomiting.  No fevers, chills.  No focal weakness.  No dysuria.    All other systems reviewed and negative.   PHYSICAL EXAM: VS:  BP 122/72  Pulse 61  Ht 5\' 9"  (1.753 m)  Wt 168 lb (76.204 kg)  BMI 24.8 kg/m2 Well nourished, well developed, in no acute distress HEENT: normal Neck: no JVD, no carotid bruits Cardiac:  normal S1, S2; RRR;  Lungs:  clear to auscultation bilaterally, no wheezing, rhonchi or rales Abd: soft, nontender, no hepatomegaly Ext: no edema Skin: warm and dry Neuro:   no focal abnormalities noted  EKG:    NSR, nonspecific ST changes   ASSESSMENT AND PLAN:  Other primary  cardiomyopathies  Diagnostic Imaging:EKG Harward,Amy 04/19/2012 08:59:38 AM > Sundra Haddix,JAY 04/19/2012 09:27:19 AM > Sinus bradycardia, nonspecific ST segment changes  LV function has improved by last stress test.  2. Nonspecific abnormal unspecified cardiovascular function study  Inferior wall defect on previous stress test. No angina on medical therapy. We again discussed cardiac catheterization but he would like to hold off.  3. Essential hypertension, benign  Continue Carvedilol Tablet, 12.5 MG, 1 tablet with food, Orally, Twice a day Continue Hydrochlorothiazide Tablet, 25 MG, 1 tablet, Orally, Once a day Continue Amlodipine Besylate Tablet, 10 MG, 1 tablet, Orally, Once a day Controlled. If BP drops more, may need to decrease meds.  WOuld decrease amlodipine to 5 mg daily first if BP got too low 4. Mixed hyperlipidemia  Continue Pravastatin Sodium Tablet, 20 MG, 1 tablet, Orally, Once a day Continue Fish Oil Capsule, 1000 MG, 2 capsules, Orally, twice a day LDL 100, HDL 32, TG controlled in 4/14   Signed, Fredric Mare, MD, [facility] 04/25/2013 9:40 AM

## 2013-04-25 NOTE — Patient Instructions (Signed)
Your physician wants you to follow-up in: 1 year with Dr. Varanasi. You will receive a reminder letter in the mail two months in advance. If you don't receive a letter, please call our office to schedule the follow-up appointment.  Your physician recommends that you continue on your current medications as directed. Please refer to the Current Medication list given to you today.  

## 2013-04-28 ENCOUNTER — Other Ambulatory Visit (HOSPITAL_BASED_OUTPATIENT_CLINIC_OR_DEPARTMENT_OTHER): Payer: Commercial Managed Care - PPO | Admitting: Lab

## 2013-04-28 ENCOUNTER — Encounter (INDEPENDENT_AMBULATORY_CARE_PROVIDER_SITE_OTHER): Payer: Self-pay

## 2013-04-28 ENCOUNTER — Ambulatory Visit (HOSPITAL_BASED_OUTPATIENT_CLINIC_OR_DEPARTMENT_OTHER): Payer: Commercial Managed Care - PPO

## 2013-04-28 VITALS — BP 136/61 | HR 63 | Temp 98.3°F | Resp 17

## 2013-04-28 DIAGNOSIS — C679 Malignant neoplasm of bladder, unspecified: Secondary | ICD-10-CM

## 2013-04-28 DIAGNOSIS — Z5111 Encounter for antineoplastic chemotherapy: Secondary | ICD-10-CM

## 2013-04-28 LAB — CBC WITH DIFFERENTIAL/PLATELET
BASO%: 0.6 % (ref 0.0–2.0)
Eosinophils Absolute: 0.4 10*3/uL (ref 0.0–0.5)
HGB: 11.6 g/dL — ABNORMAL LOW (ref 13.0–17.1)
LYMPH%: 21.7 % (ref 14.0–49.0)
MCHC: 32.5 g/dL (ref 32.0–36.0)
MCV: 78.6 fL — ABNORMAL LOW (ref 79.3–98.0)
MONO#: 0.6 10*3/uL (ref 0.1–0.9)
MONO%: 7.8 % (ref 0.0–14.0)
NEUT#: 4.7 10*3/uL (ref 1.5–6.5)
RBC: 4.54 10*6/uL (ref 4.20–5.82)
RDW: 14.3 % (ref 11.0–14.6)
WBC: 7.2 10*3/uL (ref 4.0–10.3)
nRBC: 0 % (ref 0–0)

## 2013-04-28 LAB — COMPREHENSIVE METABOLIC PANEL (CC13)
ALT: 9 U/L (ref 0–55)
AST: 11 U/L (ref 5–34)
Anion Gap: 11 mEq/L (ref 3–11)
CO2: 27 mEq/L (ref 22–29)
Creatinine: 1.3 mg/dL (ref 0.7–1.3)
Potassium: 3.2 mEq/L — ABNORMAL LOW (ref 3.5–5.1)
Sodium: 140 mEq/L (ref 136–145)
Total Bilirubin: 0.88 mg/dL (ref 0.20–1.20)
Total Protein: 7.2 g/dL (ref 6.4–8.3)

## 2013-04-28 MED ORDER — SODIUM CHLORIDE 0.9 % IJ SOLN
10.0000 mL | INTRAMUSCULAR | Status: DC | PRN
  Administered 2013-04-28: 10 mL
  Filled 2013-04-28: qty 10

## 2013-04-28 MED ORDER — SODIUM CHLORIDE 0.9 % IV SOLN
1000.0000 mg/m2 | Freq: Once | INTRAVENOUS | Status: AC
  Administered 2013-04-28: 1938 mg via INTRAVENOUS
  Filled 2013-04-28: qty 50.97

## 2013-04-28 MED ORDER — POTASSIUM CHLORIDE 2 MEQ/ML IV SOLN
Freq: Once | INTRAVENOUS | Status: AC
  Administered 2013-04-28: 11:00:00 via INTRAVENOUS
  Filled 2013-04-28: qty 10

## 2013-04-28 MED ORDER — PALONOSETRON HCL INJECTION 0.25 MG/5ML
0.2500 mg | Freq: Once | INTRAVENOUS | Status: AC
  Administered 2013-04-28: 0.25 mg via INTRAVENOUS

## 2013-04-28 MED ORDER — SODIUM CHLORIDE 0.9 % IV SOLN
Freq: Once | INTRAVENOUS | Status: AC
  Administered 2013-04-28: 13:00:00 via INTRAVENOUS

## 2013-04-28 MED ORDER — DEXAMETHASONE SODIUM PHOSPHATE 20 MG/5ML IJ SOLN
INTRAMUSCULAR | Status: AC
  Filled 2013-04-28: qty 5

## 2013-04-28 MED ORDER — SODIUM CHLORIDE 0.9 % IV SOLN
70.0000 mg/m2 | Freq: Once | INTRAVENOUS | Status: AC
  Administered 2013-04-28: 134 mg via INTRAVENOUS
  Filled 2013-04-28: qty 134

## 2013-04-28 MED ORDER — DEXAMETHASONE SODIUM PHOSPHATE 20 MG/5ML IJ SOLN
12.0000 mg | Freq: Once | INTRAMUSCULAR | Status: AC
  Administered 2013-04-28: 12 mg via INTRAVENOUS

## 2013-04-28 MED ORDER — PALONOSETRON HCL INJECTION 0.25 MG/5ML
INTRAVENOUS | Status: AC
  Filled 2013-04-28: qty 5

## 2013-04-28 MED ORDER — HEPARIN SOD (PORK) LOCK FLUSH 100 UNIT/ML IV SOLN
500.0000 [IU] | Freq: Once | INTRAVENOUS | Status: AC | PRN
  Administered 2013-04-28: 500 [IU]
  Filled 2013-04-28: qty 5

## 2013-04-28 MED ORDER — PROCHLORPERAZINE MALEATE 10 MG PO TABS: 10.0000 mg | ORAL_TABLET | Freq: Four times a day (QID) | ORAL | Status: DC | PRN

## 2013-04-28 MED ORDER — SODIUM CHLORIDE 0.9 % IV SOLN
150.0000 mg | Freq: Once | INTRAVENOUS | Status: AC
  Administered 2013-04-28: 150 mg via INTRAVENOUS
  Filled 2013-04-28: qty 5

## 2013-04-28 NOTE — Progress Notes (Signed)
prehydration UOP:  posthydration UOP: 

## 2013-04-28 NOTE — Patient Instructions (Addendum)
[facility] Discharge Instructions for Patients Receiving Chemotherapy  Today you received the following chemotherapy agents cisplatin, gemzar.  DRINK LOTS OF FLUIDS.  At least 64 Oz (8 glasses) daily.    To help prevent nausea and vomiting after your treatment, we encourage you to take your nausea medication (Ondansetron and Prochlorperazine) as needed.  You may take your other nausea medication prochlorperazine tablets every 6 hours as needed for nausea.  Please avoid taking your nausea prescription (ondansetron) for the next 72 hours.      If you develop nausea and vomiting that is not controlled by your nausea medication, call the clinic.   BELOW ARE SYMPTOMS THAT SHOULD BE REPORTED IMMEDIATELY:  *FEVER GREATER THAN 100.5 F  *CHILLS WITH OR WITHOUT FEVER  NAUSEA AND VOMITING THAT IS NOT CONTROLLED WITH YOUR NAUSEA MEDICATION  *UNUSUAL SHORTNESS OF BREATH  *UNUSUAL BRUISING OR BLEEDING  TENDERNESS IN MOUTH AND THROAT WITH OR WITHOUT PRESENCE OF ULCERS  *URINARY PROBLEMS  *BOWEL PROBLEMS  UNUSUAL RASH Items with * indicate a potential emergency and should be followed up as soon as possible.  Feel free to call the clinic you have any questions or concerns. The clinic phone number is [phone].

## 2013-05-01 ENCOUNTER — Telehealth: Payer: Self-pay | Admitting: *Deleted

## 2013-05-01 NOTE — Telephone Encounter (Signed)
Message copied by Augusto Garbe on Mon May 01, 2013  5:10 PM ------      Message from: Caren Griffins      Created: Fri Apr 28, 2013  2:59 PM       [phone] (M)            1st CDDP/ gemzar, Dr Clelia Croft ------

## 2013-05-01 NOTE — Telephone Encounter (Signed)
Called Kenneth Lawrence at mobile [phone]) number(s).  Messge left requesting a return call for chemotherapy follow up.  Awaiting return call from patient.

## 2013-05-05 ENCOUNTER — Ambulatory Visit (HOSPITAL_BASED_OUTPATIENT_CLINIC_OR_DEPARTMENT_OTHER): Payer: Commercial Managed Care - PPO

## 2013-05-05 ENCOUNTER — Encounter: Payer: Self-pay | Admitting: Oncology

## 2013-05-05 ENCOUNTER — Encounter (INDEPENDENT_AMBULATORY_CARE_PROVIDER_SITE_OTHER): Payer: Self-pay

## 2013-05-05 ENCOUNTER — Ambulatory Visit (HOSPITAL_BASED_OUTPATIENT_CLINIC_OR_DEPARTMENT_OTHER): Payer: Commercial Managed Care - PPO | Admitting: Oncology

## 2013-05-05 ENCOUNTER — Other Ambulatory Visit (HOSPITAL_BASED_OUTPATIENT_CLINIC_OR_DEPARTMENT_OTHER): Payer: Commercial Managed Care - PPO | Admitting: Lab

## 2013-05-05 VITALS — BP 99/58 | HR 73 | Temp 97.6°F | Resp 20 | Ht 69.0 in | Wt 164.4 lb

## 2013-05-05 DIAGNOSIS — Z5111 Encounter for antineoplastic chemotherapy: Secondary | ICD-10-CM

## 2013-05-05 DIAGNOSIS — C679 Malignant neoplasm of bladder, unspecified: Secondary | ICD-10-CM

## 2013-05-05 DIAGNOSIS — C672 Malignant neoplasm of lateral wall of bladder: Secondary | ICD-10-CM

## 2013-05-05 LAB — CBC WITH DIFFERENTIAL/PLATELET
BASO%: 0.4 % (ref 0.0–2.0)
Basophils Absolute: 0 10*3/uL (ref 0.0–0.1)
EOS%: 2.1 % (ref 0.0–7.0)
HCT: 32 % — ABNORMAL LOW (ref 38.4–49.9)
HGB: 10.3 g/dL — ABNORMAL LOW (ref 13.0–17.1)
MCH: 25.1 pg — ABNORMAL LOW (ref 27.2–33.4)
MCHC: 32.1 g/dL (ref 32.0–36.0)
NEUT%: 70.9 % (ref 39.0–75.0)
Platelets: 148 10*3/uL (ref 140–400)
RBC: 4.09 10*6/uL — ABNORMAL LOW (ref 4.20–5.82)
lymph#: 1.1 10*3/uL (ref 0.9–3.3)

## 2013-05-05 LAB — COMPREHENSIVE METABOLIC PANEL (CC13)
AST: 17 U/L (ref 5–34)
Anion Gap: 11 mEq/L (ref 3–11)
BUN: 23.3 mg/dL (ref 7.0–26.0)
CO2: 29 mEq/L (ref 22–29)
Calcium: 9.7 mg/dL (ref 8.4–10.4)
Chloride: 98 mEq/L (ref 98–109)
Creatinine: 1.7 mg/dL — ABNORMAL HIGH (ref 0.7–1.3)
Potassium: 3.8 mEq/L (ref 3.5–5.1)

## 2013-05-05 MED ORDER — PROCHLORPERAZINE MALEATE 10 MG PO TABS
10.0000 mg | ORAL_TABLET | Freq: Once | ORAL | Status: AC
  Administered 2013-05-05: 10 mg via ORAL

## 2013-05-05 MED ORDER — PROCHLORPERAZINE MALEATE 10 MG PO TABS
ORAL_TABLET | ORAL | Status: AC
  Filled 2013-05-05: qty 1

## 2013-05-05 MED ORDER — SODIUM CHLORIDE 0.9 % IJ SOLN
10.0000 mL | INTRAMUSCULAR | Status: DC | PRN
  Administered 2013-05-05: 10 mL
  Filled 2013-05-05: qty 10

## 2013-05-05 MED ORDER — SODIUM CHLORIDE 0.9 % IV SOLN
Freq: Once | INTRAVENOUS | Status: AC
  Administered 2013-05-05: 12:00:00 via INTRAVENOUS

## 2013-05-05 MED ORDER — SODIUM CHLORIDE 0.9 % IV SOLN
1000.0000 mg/m2 | Freq: Once | INTRAVENOUS | Status: AC
  Administered 2013-05-05: 1938 mg via INTRAVENOUS
  Filled 2013-05-05: qty 50.97

## 2013-05-05 MED ORDER — HEPARIN SOD (PORK) LOCK FLUSH 100 UNIT/ML IV SOLN
500.0000 [IU] | Freq: Once | INTRAVENOUS | Status: AC | PRN
  Administered 2013-05-05: 500 [IU]
  Filled 2013-05-05: qty 5

## 2013-05-05 NOTE — Progress Notes (Signed)
Hematology and Oncology Follow Up Visit  Kenneth Lawrence [id number] [birth year] 62 y.o. 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 He has a 5.1 cm of the right posterior bladder wall and invasion into the perivesicularwith clinical staging T4 N0 disease.   Prior Therapy: On 03/16/2013 he underwent cystourethroscopy and the resection of a 10 cm right lateral bladder wall tumor.   Current therapy: Started neoadjuvant chemotherapy with Cisplatin and Gemzar on 04/28/13. He is here for cycle 1 day 8.  Interim History:  Kenneth Lawrence presents today for a followup visit. He is a pleasant gentleman with the above history. He received his first dose of chemo last week. He had intermittent nausea without vomiting. He is not reporting any hematuria or flank pain. He does report some pelvic pressure but no major changes in his appetite or performance status. He still have a percutaneous nephrostomy tube in place. Due to follow-up with Dr Berneice Heinrich and Dr [name] soon.  Medications: I have reviewed the patient's current medications.  Current Outpatient Prescriptions  Medication Sig Dispense Refill  . amLODipine (NORVASC) 10 MG tablet Take 10 mg by mouth every morning.      Marland Kitchen aspirin EC 81 MG tablet Take 81 mg by mouth every morning.       . carvedilol (COREG) 12.5 MG tablet Take 12.5 mg by mouth 2 (two) times daily with a meal.      . cholecalciferol (VITAMIN D) 1000 UNITS tablet Take 2,000 Units by mouth daily.      . fish oil-omega-3 fatty acids 1000 MG capsule Take 2 g by mouth daily.      . hydrochlorothiazide (HYDRODIURIL) 25 MG tablet Take 25 mg by mouth every morning.      . lidocaine-prilocaine (EMLA) cream Apply 1 application topically as needed.      . Meth-Hyo-M Bl-Na Phos-Ph Sal (URIBEL) 118 MG CAPS Take 1 capsule by mouth 3 (three) times daily.      . ondansetron  (ZOFRAN) 8 MG tablet Take 8 mg by mouth every 8 (eight) hours as needed for nausea.      . potassium chloride SA (K-DUR,KLOR-CON) 20 MEQ tablet Take 20 mEq by mouth daily.      . pravastatin (PRAVACHOL) 20 MG tablet Take 20 mg by mouth every morning.      . prochlorperazine (COMPAZINE) 10 MG tablet Take 1 tablet (10 mg total) by mouth every 6 (six) hours as needed (for nausea and vomiting).  30 tablet  1  . vitamin C (ASCORBIC ACID) 500 MG tablet Take 500 mg by mouth daily.       No current facility-administered medications for this visit.     Allergies: No Known Allergies  Past Medical History, Surgical history, Social history, and Family History were reviewed and updated.  Review of Systems: Constitutional:  Negative for fever, chills, night sweats, anorexia, weight loss, pain. Cardiovascular: no chest pain or dyspnea on exertion Respiratory: negative Neurological: negative Dermatological: negative ENT: negative Skin: Negative. Gastrointestinal: no abdominal pain, change in bowel habits, or black or bloody stools Genito-Urinary: negative Hematological and Lymphatic: negative Breast: negative Musculoskeletal: negative Remaining ROS negative.  Physical Exam: Blood pressure 99/58, pulse 73, temperature 97.6 F (36.4 C), temperature source Oral, resp. rate 20, height 5\' 9"  (1.753 m), weight 164 lb 6.4 oz (74.571 kg). ECOG: 1 General appearance: alert, cooperative and appears stated age Head: Normocephalic, without obvious abnormality,  atraumatic Neck: no adenopathy, no carotid bruit, no JVD, supple, symmetrical, trachea midline and thyroid not enlarged, symmetric, no tenderness/mass/nodules Lymph nodes: Cervical, supraclavicular, and axillary nodes normal. Heart:regular rate and rhythm, S1, S2 normal, no murmur, click, rub or gallop Lung:chest clear, no wheezing, rales, normal symmetric air entry Abdomen: soft, non-tender, without masses or organomegaly EXT:no erythema,  induration, or nodules   Lab Results: Lab Results  Component Value Date   WBC 5.0 05/05/2013   HGB 10.3* 05/05/2013   HCT 32.0* 05/05/2013   MCV 78.1* 05/05/2013   PLT 148 05/05/2013     Chemistry      Component Value Date/Time   NA 138 05/05/2013 0903   NA 137 03/17/2013 0435   K 3.8 05/05/2013 0903   K 3.8 03/17/2013 0435   CL 100 03/17/2013 0435   CO2 29 05/05/2013 0903   CO2 30 03/17/2013 0435   BUN 23.3 05/05/2013 0903   BUN 12 03/17/2013 0435   CREATININE 1.7* 05/05/2013 0903   CREATININE 1.48* 03/17/2013 0435      Component Value Date/Time   CALCIUM 9.7 05/05/2013 0903   CALCIUM 9.2 03/17/2013 0435   ALKPHOS 65 05/05/2013 0903   AST 17 05/05/2013 0903   ALT 13 05/05/2013 0903   BILITOT 0.62 05/05/2013 0903       Impression and Plan:   62 year old gentleman with the following issues:  1. 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 After a multidisciplinary discussion including input from radiation oncology as well as urology the consensus is to proceed with neoadjuvant chemotherapy. He will proceed with cycle 1 day 8 of chemo today without dose modification. Plan is for a total of 3-4 cycles.   2. IV access: PAC is in place and flushed with each chemo.  3. Acute renal failure: This has resolved. He has a percutaneous nephrostomy tube and we are checking his kidney function again today.   4. Follow-up: in about 2 weeks for cycle 2 day 1.  Asbury Hair 10/24/201410:15 AM

## 2013-05-05 NOTE — Patient Instructions (Signed)
[facility] Discharge Instructions for Patients Receiving Chemotherapy  Today you received the following chemotherapy agents :  Gemzar.  To help prevent nausea and vomiting after your treatment, we encourage you to take your nausea medication as instructed by your physician.   If you develop nausea and vomiting that is not controlled by your nausea medication, call the clinic.   BELOW ARE SYMPTOMS THAT SHOULD BE REPORTED IMMEDIATELY:  *FEVER GREATER THAN 100.5 F  *CHILLS WITH OR WITHOUT FEVER  NAUSEA AND VOMITING THAT IS NOT CONTROLLED WITH YOUR NAUSEA MEDICATION  *UNUSUAL SHORTNESS OF BREATH  *UNUSUAL BRUISING OR BLEEDING  TENDERNESS IN MOUTH AND THROAT WITH OR WITHOUT PRESENCE OF ULCERS  *URINARY PROBLEMS  *BOWEL PROBLEMS  UNUSUAL RASH Items with * indicate a potential emergency and should be followed up as soon as possible.  Feel free to call the clinic you have any questions or concerns. The clinic phone number is [phone].

## 2013-05-08 ENCOUNTER — Telehealth: Payer: Self-pay | Admitting: Oncology

## 2013-05-08 ENCOUNTER — Other Ambulatory Visit: Payer: Self-pay | Admitting: Urology

## 2013-05-08 DIAGNOSIS — N1330 Unspecified hydronephrosis: Secondary | ICD-10-CM

## 2013-05-08 NOTE — Telephone Encounter (Signed)
lvm for pt regarding to 11.7 appt

## 2013-05-10 ENCOUNTER — Ambulatory Visit (HOSPITAL_COMMUNITY)
Admission: RE | Admit: 2013-05-10 | Discharge: 2013-05-10 | Disposition: A | Discharge status: Home / Self Care | Payer: Commercial Managed Care - PPO | Source: Ambulatory Visit | Attending: Urology | Admitting: Urology

## 2013-05-10 DIAGNOSIS — C679 Malignant neoplasm of bladder, unspecified: Secondary | ICD-10-CM | POA: Insufficient documentation

## 2013-05-10 DIAGNOSIS — N1330 Unspecified hydronephrosis: Secondary | ICD-10-CM

## 2013-05-10 DIAGNOSIS — Z436 Encounter for attention to other artificial openings of urinary tract: Secondary | ICD-10-CM | POA: Insufficient documentation

## 2013-05-10 MED ORDER — IOHEXOL 300 MG/ML  SOLN
10.0000 mL | Freq: Once | INTRAMUSCULAR | Status: AC | PRN
  Administered 2013-05-10: 10 mL

## 2013-05-10 MED ORDER — LIDOCAINE HCL 1 % IJ SOLN
INTRAMUSCULAR | Status: AC
  Filled 2013-05-10: qty 20

## 2013-05-10 NOTE — Procedures (Signed)
Successful exchange of right nephrostomy tube with fluoroscopy.  No immediate complication.

## 2013-05-19 ENCOUNTER — Other Ambulatory Visit (HOSPITAL_BASED_OUTPATIENT_CLINIC_OR_DEPARTMENT_OTHER): Payer: Commercial Managed Care - PPO

## 2013-05-19 ENCOUNTER — Telehealth: Payer: Self-pay | Admitting: Oncology

## 2013-05-19 ENCOUNTER — Ambulatory Visit (HOSPITAL_BASED_OUTPATIENT_CLINIC_OR_DEPARTMENT_OTHER): Payer: Commercial Managed Care - PPO

## 2013-05-19 ENCOUNTER — Ambulatory Visit: Payer: Commercial Managed Care - PPO

## 2013-05-19 ENCOUNTER — Ambulatory Visit (HOSPITAL_BASED_OUTPATIENT_CLINIC_OR_DEPARTMENT_OTHER): Payer: Commercial Managed Care - PPO | Admitting: Oncology

## 2013-05-19 VITALS — BP 121/71 | HR 70 | Temp 97.0°F | Resp 18 | Ht 69.0 in | Wt 166.4 lb

## 2013-05-19 DIAGNOSIS — C679 Malignant neoplasm of bladder, unspecified: Secondary | ICD-10-CM

## 2013-05-19 DIAGNOSIS — C674 Malignant neoplasm of posterior wall of bladder: Secondary | ICD-10-CM

## 2013-05-19 DIAGNOSIS — Z5111 Encounter for antineoplastic chemotherapy: Secondary | ICD-10-CM

## 2013-05-19 DIAGNOSIS — N179 Acute kidney failure, unspecified: Secondary | ICD-10-CM

## 2013-05-19 LAB — COMPREHENSIVE METABOLIC PANEL (CC13)
Anion Gap: 10 mEq/L (ref 3–11)
BUN: 13.3 mg/dL (ref 7.0–26.0)
CO2: 26 mEq/L (ref 22–29)
Calcium: 9.7 mg/dL (ref 8.4–10.4)
Chloride: 106 mEq/L (ref 98–109)
Creatinine: 1.3 mg/dL (ref 0.7–1.3)
Glucose: 130 mg/dl (ref 70–140)

## 2013-05-19 LAB — CBC WITH DIFFERENTIAL/PLATELET
Basophils Absolute: 0 10*3/uL (ref 0.0–0.1)
EOS%: 3 % (ref 0.0–7.0)
Eosinophils Absolute: 0.2 10*3/uL (ref 0.0–0.5)
HCT: 32 % — ABNORMAL LOW (ref 38.4–49.9)
HGB: 10.3 g/dL — ABNORMAL LOW (ref 13.0–17.1)
LYMPH%: 45.1 % (ref 14.0–49.0)
MCH: 25.3 pg — ABNORMAL LOW (ref 27.2–33.4)
MCV: 78.6 fL — ABNORMAL LOW (ref 79.3–98.0)
MONO#: 0.9 10*3/uL (ref 0.1–0.9)
NEUT#: 2.3 10*3/uL (ref 1.5–6.5)
NEUT%: 37 % — ABNORMAL LOW (ref 39.0–75.0)
lymph#: 2.7 10*3/uL (ref 0.9–3.3)

## 2013-05-19 MED ORDER — SODIUM CHLORIDE 0.9 % IV SOLN
70.0000 mg/m2 | Freq: Once | INTRAVENOUS | Status: AC
  Administered 2013-05-19: 134 mg via INTRAVENOUS
  Filled 2013-05-19: qty 134

## 2013-05-19 MED ORDER — DEXAMETHASONE SODIUM PHOSPHATE 20 MG/5ML IJ SOLN
INTRAMUSCULAR | Status: AC
  Filled 2013-05-19: qty 5

## 2013-05-19 MED ORDER — POTASSIUM CHLORIDE 2 MEQ/ML IV SOLN
Freq: Once | INTRAVENOUS | Status: AC
  Administered 2013-05-19: 11:00:00 via INTRAVENOUS
  Filled 2013-05-19: qty 10

## 2013-05-19 MED ORDER — PALONOSETRON HCL INJECTION 0.25 MG/5ML
0.2500 mg | Freq: Once | INTRAVENOUS | Status: AC
  Administered 2013-05-19: 0.25 mg via INTRAVENOUS

## 2013-05-19 MED ORDER — HEPARIN SOD (PORK) LOCK FLUSH 100 UNIT/ML IV SOLN
500.0000 [IU] | Freq: Once | INTRAVENOUS | Status: AC | PRN
  Administered 2013-05-19: 500 [IU]
  Filled 2013-05-19: qty 5

## 2013-05-19 MED ORDER — SODIUM CHLORIDE 0.9 % IV SOLN
1000.0000 mg/m2 | Freq: Once | INTRAVENOUS | Status: AC
  Administered 2013-05-19: 1938 mg via INTRAVENOUS
  Filled 2013-05-19: qty 50.97

## 2013-05-19 MED ORDER — PALONOSETRON HCL INJECTION 0.25 MG/5ML
INTRAVENOUS | Status: AC
  Filled 2013-05-19: qty 5

## 2013-05-19 MED ORDER — DEXAMETHASONE SODIUM PHOSPHATE 20 MG/5ML IJ SOLN
12.0000 mg | Freq: Once | INTRAMUSCULAR | Status: AC
  Administered 2013-05-19: 12 mg via INTRAVENOUS

## 2013-05-19 MED ORDER — ONDANSETRON HCL 8 MG PO TABS: 8.0000 mg | ORAL_TABLET | Freq: Three times a day (TID) | ORAL | Status: DC | PRN

## 2013-05-19 MED ORDER — SODIUM CHLORIDE 0.9 % IJ SOLN
10.0000 mL | INTRAMUSCULAR | Status: DC | PRN
Start: 2013-05-19 — End: 2013-05-19
  Administered 2013-05-19: 10 mL
  Filled 2013-05-19: qty 10

## 2013-05-19 MED ORDER — SODIUM CHLORIDE 0.9 % IV SOLN
Freq: Once | INTRAVENOUS | Status: AC
  Administered 2013-05-19: 10:00:00 via INTRAVENOUS

## 2013-05-19 MED ORDER — SODIUM CHLORIDE 0.9 % IV SOLN
150.0000 mg | Freq: Once | INTRAVENOUS | Status: AC
  Administered 2013-05-19: 150 mg via INTRAVENOUS
  Filled 2013-05-19: qty 5

## 2013-05-19 NOTE — Progress Notes (Signed)
Pre Cisplatin void was 825 mls and post Cisplatin void was 750 mls.

## 2013-05-19 NOTE — Patient Instructions (Signed)
[facility] Discharge Instructions for Patients Receiving Chemotherapy  Today you received the following chemotherapy agents: Gemzar and Cisplatin.  To help prevent nausea and vomiting after your treatment, we encourage you to take your nausea medication as prescribed.   If you develop nausea and vomiting that is not controlled by your nausea medication, call the clinic.   BELOW ARE SYMPTOMS THAT SHOULD BE REPORTED IMMEDIATELY:  *FEVER GREATER THAN 100.5 F  *CHILLS WITH OR WITHOUT FEVER  NAUSEA AND VOMITING THAT IS NOT CONTROLLED WITH YOUR NAUSEA MEDICATION  *UNUSUAL SHORTNESS OF BREATH  *UNUSUAL BRUISING OR BLEEDING  TENDERNESS IN MOUTH AND THROAT WITH OR WITHOUT PRESENCE OF ULCERS  *URINARY PROBLEMS  *BOWEL PROBLEMS  UNUSUAL RASH Items with * indicate a potential emergency and should be followed up as soon as possible.  Feel free to call the clinic you have any questions or concerns. The clinic phone number is [phone].    

## 2013-05-19 NOTE — Telephone Encounter (Signed)
Gave pt appt for lab and MD, ML emailed [facility] for chemo

## 2013-05-19 NOTE — Progress Notes (Signed)
Hematology and Oncology Follow Up Visit  Kenneth Lawrence [id number] November 02, 1950 62 y.o. 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 He has a 5.1 cm of the right posterior bladder wall and invasion into the perivesicularwith clinical staging T4 N0 disease.   Prior Therapy: On 03/16/2013 he underwent cystourethroscopy and the resection of a 10 cm right lateral bladder wall tumor.   Current therapy: Started neoadjuvant chemotherapy with Cisplatin and Gemzar on 04/28/13. He is here for cycle 2 day 1.  Interim History:  Kenneth Lawrence presents today for a followup visit. He is a pleasant gentleman with the above history. He received his cycle of  Chemotherapy without complications. He had intermittent nausea without vomiting. He is not reporting any hematuria or flank pain. He does report some pelvic pressure but no major changes in his appetite or performance status. He still have a percutaneous nephrostomy tube in place. He is not reporting any urination problems at this time. Has not reported any hematuria or dysuria. Has not reported any peripheral neuropathy or hearing difficulties.  Medications: I have reviewed the patient's current medications.  Current Outpatient Prescriptions  Medication Sig Dispense Refill  . amLODipine (NORVASC) 10 MG tablet Take 10 mg by mouth every morning.      Marland Kitchen aspirin EC 81 MG tablet Take 81 mg by mouth every morning.       . carvedilol (COREG) 12.5 MG tablet Take 12.5 mg by mouth 2 (two) times daily with a meal.      . cholecalciferol (VITAMIN D) 1000 UNITS tablet Take 2,000 Units by mouth daily.      . fish oil-omega-3 fatty acids 1000 MG capsule Take 2 g by mouth daily.      . hydrochlorothiazide (HYDRODIURIL) 25 MG tablet Take 25 mg by mouth every morning.      . lidocaine-prilocaine (EMLA) cream Apply 1 application topically as needed.       . Meth-Hyo-M Bl-Na Phos-Ph Sal (URIBEL) 118 MG CAPS Take 1 capsule by mouth 3 (three) times daily.      . ondansetron (ZOFRAN) 8 MG tablet Take 1 tablet (8 mg total) by mouth every 8 (eight) hours as needed for nausea.  20 tablet  1  . potassium chloride SA (K-DUR,KLOR-CON) 20 MEQ tablet Take 20 mEq by mouth daily.      . pravastatin (PRAVACHOL) 20 MG tablet Take 20 mg by mouth every morning.      . prochlorperazine (COMPAZINE) 10 MG tablet Take 1 tablet (10 mg total) by mouth every 6 (six) hours as needed (for nausea and vomiting).  30 tablet  1  . vitamin C (ASCORBIC ACID) 500 MG tablet Take 500 mg by mouth daily.       No current facility-administered medications for this visit.     Allergies: No Known Allergies  Past Medical History, Surgical history, Social history, and Family History were reviewed and updated.  Review of Systems:  Remaining ROS negative.  Physical Exam: Blood pressure 121/71, pulse 70, temperature 97 F (36.1 C), temperature source Oral, resp. rate 18, height 5\' 9"  (1.753 m), weight 166 lb 6.4 oz (75.479 kg), SpO2 100.00%. ECOG: 1 General appearance: alert, cooperative and appears stated age Head: Normocephalic, without obvious abnormality, atraumatic Neck: no adenopathy, no carotid bruit, no JVD, supple, symmetrical, trachea midline and thyroid not enlarged, symmetric, no tenderness/mass/nodules Lymph nodes: Cervical, supraclavicular, and axillary nodes normal. Heart:regular rate  and rhythm, S1, S2 normal, no murmur, click, rub or gallop Lung:chest clear, no wheezing, rales, normal symmetric air entry Abdomen: soft, non-tender, without masses or organomegaly EXT:no erythema, induration, or nodules   Lab Results: Lab Results  Component Value Date   WBC 6.1 05/19/2013   HGB 10.3* 05/19/2013   HCT 32.0* 05/19/2013   MCV 78.6* 05/19/2013   PLT 539* 05/19/2013     Chemistry      Component Value Date/Time   NA 138 05/05/2013 0903   NA 137 03/17/2013 0435    K 3.8 05/05/2013 0903   K 3.8 03/17/2013 0435   CL 100 03/17/2013 0435   CO2 29 05/05/2013 0903   CO2 30 03/17/2013 0435   BUN 23.3 05/05/2013 0903   BUN 12 03/17/2013 0435   CREATININE 1.7* 05/05/2013 0903   CREATININE 1.48* 03/17/2013 0435      Component Value Date/Time   CALCIUM 9.7 05/05/2013 0903   CALCIUM 9.2 03/17/2013 0435   ALKPHOS 65 05/05/2013 0903   AST 17 05/05/2013 0903   ALT 13 05/05/2013 0903   BILITOT 0.62 05/05/2013 0903       Impression and Plan:   62 year old gentleman with the following issues:  1. 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 After a multidisciplinary discussion including input from radiation oncology as well as urology the consensus is to proceed with neoadjuvant chemotherapy. He will proceed with cycle 2 day 1 of chemo today without dose modification. Plan is for a total of 3 cycles.   2. IV access: PAC is in place and flushed with each chemo.  3. Acute renal failure: This has resolved. He has a percutaneous nephrostomy tube and we are checking his kidney function again today. His creatinine bumps again we will hold cis-platinum and proceed with only Gemzar.  4. Follow-up: He will return next week for day 8 cycle 2 and on 11/28 for the beginning of cycle 3.  Kenneth Lawrence 11/7/20148:48 AM

## 2013-05-26 ENCOUNTER — Ambulatory Visit (HOSPITAL_BASED_OUTPATIENT_CLINIC_OR_DEPARTMENT_OTHER): Payer: Commercial Managed Care - PPO

## 2013-05-26 ENCOUNTER — Other Ambulatory Visit (HOSPITAL_BASED_OUTPATIENT_CLINIC_OR_DEPARTMENT_OTHER): Payer: Commercial Managed Care - PPO

## 2013-05-26 DIAGNOSIS — Z5111 Encounter for antineoplastic chemotherapy: Secondary | ICD-10-CM

## 2013-05-26 DIAGNOSIS — C674 Malignant neoplasm of posterior wall of bladder: Secondary | ICD-10-CM

## 2013-05-26 DIAGNOSIS — C679 Malignant neoplasm of bladder, unspecified: Secondary | ICD-10-CM

## 2013-05-26 LAB — COMPREHENSIVE METABOLIC PANEL (CC13)
Alkaline Phosphatase: 69 U/L (ref 40–150)
Anion Gap: 11 mEq/L (ref 3–11)
BUN: 18.4 mg/dL (ref 7.0–26.0)
CO2: 27 mEq/L (ref 22–29)
Calcium: 9.4 mg/dL (ref 8.4–10.4)
Chloride: 101 mEq/L (ref 98–109)
Creatinine: 1.4 mg/dL — ABNORMAL HIGH (ref 0.7–1.3)
Sodium: 139 mEq/L (ref 136–145)
Total Bilirubin: 0.28 mg/dL (ref 0.20–1.20)

## 2013-05-26 LAB — CBC WITH DIFFERENTIAL/PLATELET
EOS%: 0.8 % (ref 0.0–7.0)
Eosinophils Absolute: 0 10*3/uL (ref 0.0–0.5)
HCT: 32.7 % — ABNORMAL LOW (ref 38.4–49.9)
LYMPH%: 34.9 % (ref 14.0–49.0)
MONO#: 0.3 10*3/uL (ref 0.1–0.9)
NEUT#: 2.8 10*3/uL (ref 1.5–6.5)
NEUT%: 57.7 % (ref 39.0–75.0)
Platelets: 349 10*3/uL (ref 140–400)
RDW: 15.8 % — ABNORMAL HIGH (ref 11.0–14.6)
WBC: 4.8 10*3/uL (ref 4.0–10.3)
lymph#: 1.7 10*3/uL (ref 0.9–3.3)

## 2013-05-26 MED ORDER — SODIUM CHLORIDE 0.9 % IV SOLN
1000.0000 mg/m2 | Freq: Once | INTRAVENOUS | Status: AC
  Administered 2013-05-26: 1938 mg via INTRAVENOUS
  Filled 2013-05-26: qty 50.97

## 2013-05-26 MED ORDER — HEPARIN SOD (PORK) LOCK FLUSH 100 UNIT/ML IV SOLN
500.0000 [IU] | Freq: Once | INTRAVENOUS | Status: AC | PRN
  Administered 2013-05-26: 500 [IU]
  Filled 2013-05-26: qty 5

## 2013-05-26 MED ORDER — PROCHLORPERAZINE MALEATE 10 MG PO TABS
10.0000 mg | ORAL_TABLET | Freq: Once | ORAL | Status: AC
  Administered 2013-05-26: 10 mg via ORAL

## 2013-05-26 MED ORDER — SODIUM CHLORIDE 0.9 % IJ SOLN
10.0000 mL | INTRAMUSCULAR | Status: DC | PRN
  Administered 2013-05-26: 10 mL
  Filled 2013-05-26: qty 10

## 2013-05-26 MED ORDER — SODIUM CHLORIDE 0.9 % IV SOLN
Freq: Once | INTRAVENOUS | Status: AC
  Administered 2013-05-26: 13:00:00 via INTRAVENOUS

## 2013-05-26 MED ORDER — PROCHLORPERAZINE MALEATE 10 MG PO TABS
ORAL_TABLET | ORAL | Status: AC
  Filled 2013-05-26: qty 1

## 2013-05-26 NOTE — Patient Instructions (Signed)
[facility] Discharge Instructions for Patients Receiving Chemotherapy  Today you received the following chemotherapy agents Gemzar.  To help prevent nausea and vomiting after your treatment, we encourage you to take your nausea medication as prescribed.   If you develop nausea and vomiting that is not controlled by your nausea medication, call the clinic.   BELOW ARE SYMPTOMS THAT SHOULD BE REPORTED IMMEDIATELY:  *FEVER GREATER THAN 100.5 F  *CHILLS WITH OR WITHOUT FEVER  NAUSEA AND VOMITING THAT IS NOT CONTROLLED WITH YOUR NAUSEA MEDICATION  *UNUSUAL SHORTNESS OF BREATH  *UNUSUAL BRUISING OR BLEEDING  TENDERNESS IN MOUTH AND THROAT WITH OR WITHOUT PRESENCE OF ULCERS  *URINARY PROBLEMS  *BOWEL PROBLEMS  UNUSUAL RASH Items with * indicate a potential emergency and should be followed up as soon as possible.  Feel free to call the clinic you have any questions or concerns. The clinic phone number is [phone].    

## 2013-06-01 ENCOUNTER — Telehealth: Payer: Self-pay

## 2013-06-01 MED ORDER — HYDROCHLOROTHIAZIDE 25 MG PO TABS: 25.0000 mg | ORAL_TABLET | Freq: Every morning | ORAL | Status: DC

## 2013-06-01 NOTE — Telephone Encounter (Signed)
Refilled

## 2013-06-09 ENCOUNTER — Ambulatory Visit (HOSPITAL_BASED_OUTPATIENT_CLINIC_OR_DEPARTMENT_OTHER): Payer: Commercial Managed Care - PPO | Admitting: Oncology

## 2013-06-09 ENCOUNTER — Other Ambulatory Visit (HOSPITAL_BASED_OUTPATIENT_CLINIC_OR_DEPARTMENT_OTHER): Payer: Commercial Managed Care - PPO

## 2013-06-09 ENCOUNTER — Telehealth: Payer: Self-pay | Admitting: Oncology

## 2013-06-09 ENCOUNTER — Ambulatory Visit (HOSPITAL_BASED_OUTPATIENT_CLINIC_OR_DEPARTMENT_OTHER): Payer: Commercial Managed Care - PPO

## 2013-06-09 VITALS — BP 125/75 | HR 72 | Temp 97.7°F | Resp 18 | Ht 69.0 in | Wt 161.2 lb

## 2013-06-09 DIAGNOSIS — C674 Malignant neoplasm of posterior wall of bladder: Secondary | ICD-10-CM

## 2013-06-09 DIAGNOSIS — Z5111 Encounter for antineoplastic chemotherapy: Secondary | ICD-10-CM

## 2013-06-09 DIAGNOSIS — C679 Malignant neoplasm of bladder, unspecified: Secondary | ICD-10-CM

## 2013-06-09 LAB — CBC WITH DIFFERENTIAL/PLATELET
BASO%: 0.6 % (ref 0.0–2.0)
EOS%: 7.3 % — ABNORMAL HIGH (ref 0.0–7.0)
HCT: 31.5 % — ABNORMAL LOW (ref 38.4–49.9)
MCH: 26 pg — ABNORMAL LOW (ref 27.2–33.4)
MCHC: 32.4 g/dL (ref 32.0–36.0)
MONO#: 0.9 10*3/uL (ref 0.1–0.9)
MONO%: 19.1 % — ABNORMAL HIGH (ref 0.0–14.0)
RBC: 3.92 10*6/uL — ABNORMAL LOW (ref 4.20–5.82)
WBC: 4.8 10*3/uL (ref 4.0–10.3)
lymph#: 1.9 10*3/uL (ref 0.9–3.3)

## 2013-06-09 LAB — COMPREHENSIVE METABOLIC PANEL (CC13)
ALT: 8 U/L (ref 0–55)
Alkaline Phosphatase: 67 U/L (ref 40–150)
Anion Gap: 10 mEq/L (ref 3–11)
BUN: 15.9 mg/dL (ref 7.0–26.0)
CO2: 26 mEq/L (ref 22–29)
Calcium: 9.2 mg/dL (ref 8.4–10.4)
Chloride: 104 mEq/L (ref 98–109)
Sodium: 141 mEq/L (ref 136–145)
Total Protein: 6.8 g/dL (ref 6.4–8.3)

## 2013-06-09 MED ORDER — SODIUM CHLORIDE 0.9 % IV SOLN
1000.0000 mg/m2 | Freq: Once | INTRAVENOUS | Status: AC
  Administered 2013-06-09: 1938 mg via INTRAVENOUS
  Filled 2013-06-09: qty 50.97

## 2013-06-09 MED ORDER — SODIUM CHLORIDE 0.9 % IV SOLN
70.0000 mg/m2 | Freq: Once | INTRAVENOUS | Status: AC
  Administered 2013-06-09: 134 mg via INTRAVENOUS
  Filled 2013-06-09: qty 134

## 2013-06-09 MED ORDER — DEXAMETHASONE SODIUM PHOSPHATE 20 MG/5ML IJ SOLN
12.0000 mg | Freq: Once | INTRAMUSCULAR | Status: AC
  Administered 2013-06-09: 12 mg via INTRAVENOUS

## 2013-06-09 MED ORDER — SODIUM CHLORIDE 0.9 % IV SOLN
Freq: Once | INTRAVENOUS | Status: AC
  Administered 2013-06-09: 10:00:00 via INTRAVENOUS

## 2013-06-09 MED ORDER — ONDANSETRON HCL 8 MG PO TABS: 8.0000 mg | ORAL_TABLET | Freq: Three times a day (TID) | ORAL | Status: DC | PRN

## 2013-06-09 MED ORDER — DEXAMETHASONE SODIUM PHOSPHATE 20 MG/5ML IJ SOLN
INTRAMUSCULAR | Status: AC
  Filled 2013-06-09: qty 5

## 2013-06-09 MED ORDER — SODIUM CHLORIDE 0.9 % IJ SOLN
10.0000 mL | INTRAMUSCULAR | Status: DC | PRN
  Administered 2013-06-09: 10 mL
  Filled 2013-06-09: qty 10

## 2013-06-09 MED ORDER — HEPARIN SOD (PORK) LOCK FLUSH 100 UNIT/ML IV SOLN
500.0000 [IU] | Freq: Once | INTRAVENOUS | Status: AC | PRN
  Administered 2013-06-09: 500 [IU]
  Filled 2013-06-09: qty 5

## 2013-06-09 MED ORDER — PALONOSETRON HCL INJECTION 0.25 MG/5ML
INTRAVENOUS | Status: AC
  Filled 2013-06-09: qty 5

## 2013-06-09 MED ORDER — POTASSIUM CHLORIDE 2 MEQ/ML IV SOLN
Freq: Once | INTRAVENOUS | Status: AC
  Administered 2013-06-09: 10:00:00 via INTRAVENOUS
  Filled 2013-06-09: qty 10

## 2013-06-09 MED ORDER — SODIUM CHLORIDE 0.9 % IV SOLN
150.0000 mg | Freq: Once | INTRAVENOUS | Status: AC
  Administered 2013-06-09: 150 mg via INTRAVENOUS
  Filled 2013-06-09: qty 5

## 2013-06-09 MED ORDER — PALONOSETRON HCL INJECTION 0.25 MG/5ML
0.2500 mg | Freq: Once | INTRAVENOUS | Status: AC
  Administered 2013-06-09: 0.25 mg via INTRAVENOUS

## 2013-06-09 NOTE — Progress Notes (Signed)
Urine output = 725 cc pre-Cisplatin

## 2013-06-09 NOTE — Telephone Encounter (Signed)
gv and printed papt sched and avs forpt for DEC....gv pt barium

## 2013-06-09 NOTE — Progress Notes (Signed)
Urine output = 675cc post Cisplatin

## 2013-06-09 NOTE — Progress Notes (Signed)
Hematology and Oncology Follow Up Visit  Kenneth Lawrence [id number] January 10, 1951 62 y.o. 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 He has a 5.1 cm of the right posterior bladder wall and invasion into the perivesicularwith clinical staging T4 N0 disease.   Prior Therapy: On 03/16/2013 he underwent cystourethroscopy and the resection of a 10 cm right lateral bladder wall tumor.   Current therapy: Started neoadjuvant chemotherapy with Cisplatin and Gemzar on 04/28/13. He is here for cycle 3 day 1.  Interim History:  [name] presents today for a followup visit. He is a pleasant gentleman with the above history. He received his the last cycle of  Chemotherapy without complications. He had intermittent nausea without vomiting. He is not reporting any hematuria or flank pain. He does report some pelvic pressure but no major changes in his appetite or performance status. He still have a percutaneous nephrostomy tube in place. He is not reporting any urination problems at this time. Has not reported any hematuria or dysuria. Has not reported any peripheral neuropathy or hearing difficulties. He did have mild nausea which was relieved by Zofran without any complications. He continues to perform activities of daily living without any decline.  Medications: I have reviewed the patient's current medications.  Current Outpatient Prescriptions  Medication Sig Dispense Refill  . amLODipine (NORVASC) 10 MG tablet Take 10 mg by mouth every morning.      Marland Kitchen aspirin EC 81 MG tablet Take 81 mg by mouth every morning.       . carvedilol (COREG) 12.5 MG tablet Take 12.5 mg by mouth 2 (two) times daily with a meal.      . cholecalciferol (VITAMIN D) 1000 UNITS tablet Take 2,000 Units by mouth daily.      . fish oil-omega-3 fatty acids 1000 MG capsule Take 2 g by mouth daily.      .  hydrochlorothiazide (HYDRODIURIL) 25 MG tablet Take 1 tablet (25 mg total) by mouth every morning.  30 tablet  11  . lidocaine-prilocaine (EMLA) cream Apply 1 application topically as needed.      . Meth-Hyo-M Bl-Na Phos-Ph Sal (URIBEL) 118 MG CAPS Take 1 capsule by mouth 3 (three) times daily.      . ondansetron (ZOFRAN) 8 MG tablet Take 1 tablet (8 mg total) by mouth every 8 (eight) hours as needed for nausea.  20 tablet  1  . potassium chloride SA (K-DUR,KLOR-CON) 20 MEQ tablet Take 20 mEq by mouth daily.      . pravastatin (PRAVACHOL) 20 MG tablet Take 20 mg by mouth every morning.      . prochlorperazine (COMPAZINE) 10 MG tablet Take 1 tablet (10 mg total) by mouth every 6 (six) hours as needed (for nausea and vomiting).  30 tablet  1  . vitamin C (ASCORBIC ACID) 500 MG tablet Take 500 mg by mouth daily.       No current facility-administered medications for this visit.     Allergies: No Known Allergies  Past Medical History, Surgical history, Social history, and Family History were reviewed and updated.  Review of Systems:  Remaining ROS negative.  Physical Exam: Blood pressure 125/75, pulse 72, temperature 97.7 F (36.5 C), temperature source Oral, resp. rate 18, height 5\' 9"  (1.753 m), weight 161 lb 3.2 oz (73.12 kg). ECOG: 1 General appearance: alert, cooperative and appears stated age Head: Normocephalic, without obvious abnormality, atraumatic Neck:  no adenopathy, no carotid bruit, no JVD, supple, symmetrical, trachea midline and thyroid not enlarged, symmetric, no tenderness/mass/nodules Lymph nodes: Cervical, supraclavicular, and axillary nodes normal. Heart:regular rate and rhythm, S1, S2 normal, no murmur, click, rub or gallop Lung:chest clear, no wheezing, rales, normal symmetric air entry Abdomen: soft, non-tender, without masses or organomegaly EXT:no erythema, induration, or nodules   Lab Results: Lab Results  Component Value Date   WBC 4.8 06/09/2013   HGB  10.2* 06/09/2013   HCT 31.5* 06/09/2013   MCV 80.4 06/09/2013   PLT 251 06/09/2013     Chemistry      Component Value Date/Time   NA 139 05/26/2013 1147   NA 137 03/17/2013 0435   K 3.7 05/26/2013 1147   K 3.8 03/17/2013 0435   CL 100 03/17/2013 0435   CO2 27 05/26/2013 1147   CO2 30 03/17/2013 0435   BUN 18.4 05/26/2013 1147   BUN 12 03/17/2013 0435   CREATININE 1.4* 05/26/2013 1147   CREATININE 1.48* 03/17/2013 0435      Component Value Date/Time   CALCIUM 9.4 05/26/2013 1147   CALCIUM 9.2 03/17/2013 0435   ALKPHOS 69 05/26/2013 1147   AST 19 05/26/2013 1147   ALT 19 05/26/2013 1147   BILITOT 0.28 05/26/2013 1147       Impression and Plan:   62 year old gentleman with the following issues:  1. 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 After a multidisciplinary discussion including input from radiation oncology as well as urology the consensus is to proceed with neoadjuvant chemotherapy. He will proceed with cycle 3 day 1 of chemotherapy today without dose modification. Plan is for a total of 3 cycles. I will set him up with a CT scan to be done at the end of this month to insure that he has no systemic disease prior to possible cystectomy.  2. IV access: PAC is in place and flushed with each chemo.  3. Acute renal failure: This has resolved. He has a percutaneous nephrostomy tube and we are checking his kidney function again today.   4. Follow-up: He will return next week for day 8 cycle 3 and on 12/5.  [name] 11/28/20148:38 AM

## 2013-06-16 ENCOUNTER — Ambulatory Visit (HOSPITAL_BASED_OUTPATIENT_CLINIC_OR_DEPARTMENT_OTHER): Payer: Commercial Managed Care - PPO | Admitting: Oncology

## 2013-06-16 ENCOUNTER — Other Ambulatory Visit: Payer: Commercial Managed Care - PPO

## 2013-06-16 ENCOUNTER — Encounter: Payer: Self-pay | Admitting: Oncology

## 2013-06-16 ENCOUNTER — Ambulatory Visit (HOSPITAL_BASED_OUTPATIENT_CLINIC_OR_DEPARTMENT_OTHER): Payer: Commercial Managed Care - PPO

## 2013-06-16 VITALS — BP 103/60 | HR 71 | Temp 97.2°F | Resp 18 | Ht 69.0 in | Wt 156.3 lb

## 2013-06-16 DIAGNOSIS — C674 Malignant neoplasm of posterior wall of bladder: Secondary | ICD-10-CM

## 2013-06-16 DIAGNOSIS — C679 Malignant neoplasm of bladder, unspecified: Secondary | ICD-10-CM

## 2013-06-16 DIAGNOSIS — Z5111 Encounter for antineoplastic chemotherapy: Secondary | ICD-10-CM

## 2013-06-16 LAB — COMPREHENSIVE METABOLIC PANEL (CC13)
ALT: 17 U/L (ref 0–55)
Albumin: 3.2 g/dL — ABNORMAL LOW (ref 3.5–5.0)
Anion Gap: 10 mEq/L (ref 3–11)
CO2: 28 mEq/L (ref 22–29)
Chloride: 97 mEq/L — ABNORMAL LOW (ref 98–109)
Creatinine: 1.9 mg/dL — ABNORMAL HIGH (ref 0.7–1.3)
Glucose: 235 mg/dl — ABNORMAL HIGH (ref 70–140)
Potassium: 3.6 mEq/L (ref 3.5–5.1)
Sodium: 135 mEq/L — ABNORMAL LOW (ref 136–145)
Total Bilirubin: 0.59 mg/dL (ref 0.20–1.20)
Total Protein: 7.1 g/dL (ref 6.4–8.3)

## 2013-06-16 LAB — CBC WITH DIFFERENTIAL/PLATELET
Basophils Absolute: 0 10*3/uL (ref 0.0–0.1)
Eosinophils Absolute: 0 10*3/uL (ref 0.0–0.5)
HGB: 9.7 g/dL — ABNORMAL LOW (ref 13.0–17.1)
MCV: 79.9 fL (ref 79.3–98.0)
MONO#: 0.5 10*3/uL (ref 0.1–0.9)
MONO%: 7.8 % (ref 0.0–14.0)
NEUT#: 4.2 10*3/uL (ref 1.5–6.5)
RBC: 3.69 10*6/uL — ABNORMAL LOW (ref 4.20–5.82)
RDW: 18.9 % — ABNORMAL HIGH (ref 11.0–14.6)
WBC: 6.1 10*3/uL (ref 4.0–10.3)
lymph#: 1.4 10*3/uL (ref 0.9–3.3)
nRBC: 0 % (ref 0–0)

## 2013-06-16 MED ORDER — GEMCITABINE HCL CHEMO INJECTION 1 GM/26.3ML
1000.0000 mg/m2 | Freq: Once | INTRAVENOUS | Status: AC
  Administered 2013-06-16: 1938 mg via INTRAVENOUS
  Filled 2013-06-16: qty 50.97

## 2013-06-16 MED ORDER — SODIUM CHLORIDE 0.9 % IJ SOLN
10.0000 mL | INTRAMUSCULAR | Status: DC | PRN
  Administered 2013-06-16: 10 mL
  Filled 2013-06-16: qty 10

## 2013-06-16 MED ORDER — PROCHLORPERAZINE MALEATE 10 MG PO TABS
ORAL_TABLET | ORAL | Status: AC
  Filled 2013-06-16: qty 1

## 2013-06-16 MED ORDER — SODIUM CHLORIDE 0.9 % IV SOLN
Freq: Once | INTRAVENOUS | Status: AC
  Administered 2013-06-16: 10:00:00 via INTRAVENOUS

## 2013-06-16 MED ORDER — PROCHLORPERAZINE MALEATE 10 MG PO TABS
10.0000 mg | ORAL_TABLET | Freq: Once | ORAL | Status: AC
  Administered 2013-06-16: 10 mg via ORAL

## 2013-06-16 MED ORDER — HEPARIN SOD (PORK) LOCK FLUSH 100 UNIT/ML IV SOLN
500.0000 [IU] | Freq: Once | INTRAVENOUS | Status: AC | PRN
  Administered 2013-06-16: 500 [IU]
  Filled 2013-06-16: qty 5

## 2013-06-16 NOTE — Patient Instructions (Signed)
[facility] Discharge Instructions for Patients Receiving Chemotherapy  Today you received the following chemotherapy agents Gemzar.  To help prevent nausea and vomiting after your treatment, we encourage you to take your nausea medication as prescribed.   If you develop nausea and vomiting that is not controlled by your nausea medication, call the clinic.   BELOW ARE SYMPTOMS THAT SHOULD BE REPORTED IMMEDIATELY:  *FEVER GREATER THAN 100.5 F  *CHILLS WITH OR WITHOUT FEVER  NAUSEA AND VOMITING THAT IS NOT CONTROLLED WITH YOUR NAUSEA MEDICATION  *UNUSUAL SHORTNESS OF BREATH  *UNUSUAL BRUISING OR BLEEDING  TENDERNESS IN MOUTH AND THROAT WITH OR WITHOUT PRESENCE OF ULCERS  *URINARY PROBLEMS  *BOWEL PROBLEMS  UNUSUAL RASH Items with * indicate a potential emergency and should be followed up as soon as possible.  Feel free to call the clinic you have any questions or concerns. The clinic phone number is [phone].    

## 2013-06-16 NOTE — Progress Notes (Signed)
Hematology and Oncology Follow Up Visit  CHANE COWDEN [id number] 09-01-1950 62 y.o. 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 He has a 5.1 cm of the right posterior bladder wall and invasion into the perivesicularwith clinical staging T4 N0 disease.   Prior Therapy: On 03/16/2013 he underwent cystourethroscopy and the resection of a 10 cm right lateral bladder wall tumor.   Current therapy: Started neoadjuvant chemotherapy with Cisplatin and Gemzar on 04/28/13. He is here for cycle 3 day 8.  Interim History:  Mr. Eschbach presents today for a followup visit. He is a pleasant gentleman with the above history. He received his the last cycle of  Chemotherapy without complications. He had intermittent nausea without vomiting. He is not reporting any hematuria or flank pain. He does report some pelvic pressure but no major changes in his appetite or performance status. He still have a percutaneous nephrostomy tube in place. He is not reporting any urination problems at this time. Has not reported any hematuria or dysuria. Has not reported any peripheral neuropathy or hearing difficulties. He did have mild nausea which was relieved by Zofran without any complications. He continues to perform activities of daily living without any decline.  Medications: I have reviewed the patient's current medications.  Current Outpatient Prescriptions  Medication Sig Dispense Refill  . amLODipine (NORVASC) 10 MG tablet Take 10 mg by mouth every morning.      Marland Kitchen aspirin EC 81 MG tablet Take 81 mg by mouth every morning.       . carvedilol (COREG) 12.5 MG tablet Take 12.5 mg by mouth 2 (two) times daily with a meal.      . cholecalciferol (VITAMIN D) 1000 UNITS tablet Take 2,000 Units by mouth daily.      . fish oil-omega-3 fatty acids 1000 MG capsule Take 2 g by mouth daily.      .  hydrochlorothiazide (HYDRODIURIL) 25 MG tablet Take 1 tablet (25 mg total) by mouth every morning.  30 tablet  11  . lidocaine-prilocaine (EMLA) cream Apply 1 application topically as needed.      . Meth-Hyo-M Bl-Na Phos-Ph Sal (URIBEL) 118 MG CAPS Take 1 capsule by mouth 3 (three) times daily.      . ondansetron (ZOFRAN) 8 MG tablet Take 1 tablet (8 mg total) by mouth every 8 (eight) hours as needed for nausea.  20 tablet  1  . potassium chloride SA (K-DUR,KLOR-CON) 20 MEQ tablet Take 20 mEq by mouth daily.      . pravastatin (PRAVACHOL) 20 MG tablet Take 20 mg by mouth every morning.      . prochlorperazine (COMPAZINE) 10 MG tablet Take 1 tablet (10 mg total) by mouth every 6 (six) hours as needed (for nausea and vomiting).  30 tablet  1  . vitamin C (ASCORBIC ACID) 500 MG tablet Take 500 mg by mouth daily.       No current facility-administered medications for this visit.   Facility-Administered Medications Ordered in Other Visits  Medication Dose Route Frequency Provider Last Rate Last Dose  . sodium chloride 0.9 % injection 10 mL  10 mL Intracatheter PRN Benjiman Core, MD   10 mL at 06/16/13 1056     Allergies: No Known Allergies  Past Medical History, Surgical history, Social history, and Family History were reviewed and updated.  Review of Systems:  Remaining ROS negative.  Physical Exam: Blood pressure  103/60, pulse 71, temperature 97.2 F (36.2 C), temperature source Oral, resp. rate 18, height 5\' 9"  (1.753 m), weight 156 lb 4.8 oz (70.897 kg), SpO2 97.00%. ECOG: 1 General appearance: alert, cooperative and appears stated age Head: Normocephalic, without obvious abnormality, atraumatic Neck: no adenopathy, no carotid bruit, no JVD, supple, symmetrical, trachea midline and thyroid not enlarged, symmetric, no tenderness/mass/nodules Lymph nodes: Cervical, supraclavicular, and axillary nodes normal. Heart:regular rate and rhythm, S1, S2 normal, no murmur, click, rub or  gallop Lung:chest clear, no wheezing, rales, normal symmetric air entry Abdomen: soft, non-tender, without masses or organomegaly EXT:no erythema, induration, or nodules   Lab Results: Lab Results  Component Value Date   WBC 6.1 06/16/2013   HGB 9.7* 06/16/2013   HCT 29.5* 06/16/2013   MCV 79.9 06/16/2013   PLT 235 06/16/2013     Chemistry      Component Value Date/Time   NA 135* 06/16/2013 0910   NA 137 03/17/2013 0435   K 3.6 06/16/2013 0910   K 3.8 03/17/2013 0435   CL 100 03/17/2013 0435   CO2 28 06/16/2013 0910   CO2 30 03/17/2013 0435   BUN 23.1 06/16/2013 0910   BUN 12 03/17/2013 0435   CREATININE 1.9* 06/16/2013 0910   CREATININE 1.48* 03/17/2013 0435      Component Value Date/Time   CALCIUM 9.2 06/16/2013 0910   CALCIUM 9.2 03/17/2013 0435   ALKPHOS 70 06/16/2013 0910   AST 18 06/16/2013 0910   ALT 17 06/16/2013 0910   BILITOT 0.59 06/16/2013 0910       Impression and Plan:   62 year old gentleman with the following issues:  1. 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 After a multidisciplinary discussion including input from radiation oncology as well as urology the consensus is to proceed with neoadjuvant chemotherapy. He will proceed with cycle 3 day 8 of chemotherapy today without dose modification. Plan is for a total of 3 cycles. He is scheduled for a CT scan to be done at the end of this month to insure that he has no systemic disease prior to possible cystectomy.  2. IV access: PAC is in place and flushed with each chemo.  3. Acute renal failure: This has resolved. He has a percutaneous nephrostomy tube and we are checking his kidney function again today.   4. Follow-up: He will return in early January 2015 after his CT scan.  Makenzye Troutman 12/5/20141:08 PM

## 2013-06-19 ENCOUNTER — Other Ambulatory Visit (HOSPITAL_COMMUNITY): Payer: Self-pay | Admitting: Urology

## 2013-06-19 DIAGNOSIS — C679 Malignant neoplasm of bladder, unspecified: Secondary | ICD-10-CM

## 2013-06-20 ENCOUNTER — Other Ambulatory Visit: Payer: Self-pay | Admitting: Urology

## 2013-06-22 ENCOUNTER — Ambulatory Visit (HOSPITAL_COMMUNITY)
Admission: RE | Admit: 2013-06-22 | Discharge: 2013-06-22 | Disposition: A | Discharge status: Home / Self Care | Payer: Commercial Managed Care - PPO | Source: Ambulatory Visit | Attending: Urology | Admitting: Urology

## 2013-06-22 DIAGNOSIS — C679 Malignant neoplasm of bladder, unspecified: Secondary | ICD-10-CM | POA: Insufficient documentation

## 2013-06-22 DIAGNOSIS — N135 Crossing vessel and stricture of ureter without hydronephrosis: Secondary | ICD-10-CM | POA: Insufficient documentation

## 2013-06-22 DIAGNOSIS — Z436 Encounter for attention to other artificial openings of urinary tract: Secondary | ICD-10-CM | POA: Insufficient documentation

## 2013-06-22 MED ORDER — IOHEXOL 300 MG/ML  SOLN
10.0000 mL | Freq: Once | INTRAMUSCULAR | Status: AC | PRN
  Administered 2013-06-22: 10 mL

## 2013-06-22 MED ORDER — LIDOCAINE HCL 1 % IJ SOLN
INTRAMUSCULAR | Status: AC
  Filled 2013-06-22: qty 20

## 2013-06-22 NOTE — Progress Notes (Signed)
Called Dr. Emmaline Life RN, Lawanna Kobus, to inform her of successful pcn exchange and that we did not have an order to schedule a routine 6-8 week pcn exchange for January since the patient was supposed to have surgery.  Angel to check with Dr. Berneice Heinrich and let IR know if the patient needs to be scheduled for any future pcn exchanges.

## 2013-06-22 NOTE — Procedures (Signed)
R percutaneous nephrostomy exchange under fluoro No complication No blood loss. See complete dictation in [facility] PACS. 

## 2013-06-27 ENCOUNTER — Other Ambulatory Visit: Payer: Self-pay

## 2013-06-27 MED ORDER — HYDROCHLOROTHIAZIDE 25 MG PO TABS: 25.0000 mg | ORAL_TABLET | Freq: Every morning | ORAL | Status: DC

## 2013-07-10 ENCOUNTER — Other Ambulatory Visit: Payer: Commercial Managed Care - PPO | Admitting: Lab

## 2013-07-11 ENCOUNTER — Other Ambulatory Visit (HOSPITAL_BASED_OUTPATIENT_CLINIC_OR_DEPARTMENT_OTHER): Payer: Commercial Managed Care - PPO

## 2013-07-11 ENCOUNTER — Ambulatory Visit (HOSPITAL_COMMUNITY)
Admission: RE | Admit: 2013-07-11 | Discharge: 2013-07-11 | Disposition: A | Discharge status: Home / Self Care | Payer: Commercial Managed Care - PPO | Source: Ambulatory Visit | Attending: Oncology | Admitting: Oncology

## 2013-07-11 ENCOUNTER — Encounter (HOSPITAL_COMMUNITY): Payer: Self-pay

## 2013-07-11 DIAGNOSIS — I708 Atherosclerosis of other arteries: Secondary | ICD-10-CM | POA: Insufficient documentation

## 2013-07-11 DIAGNOSIS — I700 Atherosclerosis of aorta: Secondary | ICD-10-CM | POA: Insufficient documentation

## 2013-07-11 DIAGNOSIS — C679 Malignant neoplasm of bladder, unspecified: Secondary | ICD-10-CM

## 2013-07-11 DIAGNOSIS — I2510 Atherosclerotic heart disease of native coronary artery without angina pectoris: Secondary | ICD-10-CM | POA: Insufficient documentation

## 2013-07-11 DIAGNOSIS — N134 Hydroureter: Secondary | ICD-10-CM | POA: Insufficient documentation

## 2013-07-11 LAB — COMPREHENSIVE METABOLIC PANEL (CC13)
ALT: 10 U/L (ref 0–55)
AST: 11 U/L (ref 5–34)
Anion Gap: 13 mEq/L — ABNORMAL HIGH (ref 3–11)
BUN: 15.2 mg/dL (ref 7.0–26.0)
Calcium: 9.6 mg/dL (ref 8.4–10.4)
Chloride: 102 mEq/L (ref 98–109)
Creatinine: 1.6 mg/dL — ABNORMAL HIGH (ref 0.7–1.3)
Glucose: 131 mg/dl (ref 70–140)
Total Bilirubin: 0.76 mg/dL (ref 0.20–1.20)

## 2013-07-11 LAB — CBC WITH DIFFERENTIAL/PLATELET
BASO%: 0.4 % (ref 0.0–2.0)
Basophils Absolute: 0 10*3/uL (ref 0.0–0.1)
EOS%: 2 % (ref 0.0–7.0)
HCT: 32.2 % — ABNORMAL LOW (ref 38.4–49.9)
HGB: 10.4 g/dL — ABNORMAL LOW (ref 13.0–17.1)
LYMPH%: 17.8 % (ref 14.0–49.0)
MCH: 28.1 pg (ref 27.2–33.4)
MCHC: 32.4 g/dL (ref 32.0–36.0)
MCV: 86.8 fL (ref 79.3–98.0)
NEUT%: 68 % (ref 39.0–75.0)
Platelets: 225 10*3/uL (ref 140–400)
WBC: 10.5 10*3/uL — ABNORMAL HIGH (ref 4.0–10.3)
lymph#: 1.9 10*3/uL (ref 0.9–3.3)

## 2013-07-11 MED ORDER — IOHEXOL 300 MG/ML  SOLN
100.0000 mL | Freq: Once | INTRAMUSCULAR | Status: AC | PRN
  Administered 2013-07-11: 100 mL via INTRAVENOUS

## 2013-07-14 ENCOUNTER — Ambulatory Visit (HOSPITAL_BASED_OUTPATIENT_CLINIC_OR_DEPARTMENT_OTHER): Payer: Commercial Managed Care - PPO | Admitting: Oncology

## 2013-07-14 ENCOUNTER — Telehealth: Payer: Self-pay | Admitting: Oncology

## 2013-07-14 VITALS — BP 123/63 | HR 66 | Temp 97.1°F | Resp 18 | Ht 69.0 in | Wt 160.6 lb

## 2013-07-14 DIAGNOSIS — C679 Malignant neoplasm of bladder, unspecified: Secondary | ICD-10-CM

## 2013-07-14 NOTE — Telephone Encounter (Signed)
appts made per 07/14/13 POF AVS and CAL given shh °

## 2013-07-14 NOTE — Progress Notes (Signed)
Hematology and Oncology Follow Up Visit  Kenneth Lawrence [id number] [birth year] 63 y.o. 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 He has a 5.1 cm of the right posterior bladder wall and invasion into the perivesicularwith clinical staging T4 N0 disease.   Prior Therapy: On 03/16/2013 he underwent cystourethroscopy and the resection of a 10 cm right lateral bladder wall tumor.  Started neoadjuvant chemotherapy with Cisplatin and Gemzar on 04/28/13. He completed three cycles on 06/16/2013.   Current therapy: He is under consideration for surgical resection.  Interim History:  Mr. Hepp presents today for a followup visit. He is a pleasant gentleman with the above history. He received his the last cycle of  Chemotherapy without complications. He had intermittent nausea without vomiting. He is not reporting any hematuria or flank pain. He does report some pelvic pressure but no major changes in his appetite or performance status. He still have a percutaneous nephrostomy tube in place. He is not reporting any urination problems at this time. Has not reported any hematuria or dysuria. Has not reported any peripheral neuropathy or hearing difficulties. He did have mild nausea which was relieved by Zofran without any complications. He continues to perform activities of daily living without any decline. He has not reported any peripheral neuropathy or any other neurological symptoms. He has not reported any GI complications or change in his bowel habits.  Medications: I have reviewed the patient's current medications.  Current Outpatient Prescriptions  Medication Sig Dispense Refill  . amLODipine (NORVASC) 10 MG tablet Take 10 mg by mouth every morning.      Marland Kitchen aspirin EC 81 MG tablet Take 81 mg by mouth every morning.       . carvedilol (COREG) 12.5 MG tablet Take 12.5 mg by  mouth 2 (two) times daily with a meal.      . cholecalciferol (VITAMIN D) 1000 UNITS tablet Take 2,000 Units by mouth daily.      . fish oil-omega-3 fatty acids 1000 MG capsule Take 2 g by mouth daily.      . hydrochlorothiazide (HYDRODIURIL) 25 MG tablet Take 1 tablet (25 mg total) by mouth every morning.  90 tablet  3  . lidocaine-prilocaine (EMLA) cream Apply 1 application topically as needed.      . Meth-Hyo-M Bl-Na Phos-Ph Sal (URIBEL) 118 MG CAPS Take 1 capsule by mouth 3 (three) times daily.      . ondansetron (ZOFRAN) 8 MG tablet Take 1 tablet (8 mg total) by mouth every 8 (eight) hours as needed for nausea.  20 tablet  1  . potassium chloride Lawrence (K-DUR,KLOR-CON) 20 MEQ tablet Take 20 mEq by mouth daily.      . pravastatin (PRAVACHOL) 20 MG tablet Take 20 mg by mouth every morning.      . prochlorperazine (COMPAZINE) 10 MG tablet Take 1 tablet (10 mg total) by mouth every 6 (six) hours as needed (for nausea and vomiting).  30 tablet  1  . vitamin C (ASCORBIC ACID) 500 MG tablet Take 500 mg by mouth daily.       No current facility-administered medications for this visit.     Allergies: No Known Allergies  Past Medical History, Surgical history, Social history, and Family History were reviewed and updated.  Review of Systems:  Remaining ROS negative.  Physical Exam: Blood pressure 123/63, pulse 66, temperature 97.1 F (36.2 C), resp. rate 18, height  5\' 9"  (1.753 m), weight 160 lb 9.6 oz (72.848 kg). ECOG: 1 General appearance: alert, cooperative and appears stated age Head: Normocephalic, without obvious abnormality, atraumatic Neck: no adenopathy, no carotid bruit, no JVD, supple, symmetrical, trachea midline and thyroid not enlarged, symmetric, no tenderness/mass/nodules Lymph nodes: Cervical, supraclavicular, and axillary nodes normal. Heart:regular rate and rhythm, S1, S2 normal, no murmur, click, rub or gallop Lung:chest clear, no wheezing, rales, normal symmetric air  entry Abdomen: soft, non-tender, without masses or organomegaly EXT:no erythema, induration, or nodules   Lab Results: Lab Results  Component Value Date   WBC 10.5* 07/11/2013   HGB 10.4* 07/11/2013   HCT 32.2* 07/11/2013   MCV 86.8 07/11/2013   PLT 225 07/11/2013     Chemistry      Component Value Date/Time   NA 142 07/11/2013 0921   NA 137 03/17/2013 0435   K 3.9 07/11/2013 0921   K 3.8 03/17/2013 0435   CL 100 03/17/2013 0435   CO2 27 07/11/2013 0921   CO2 30 03/17/2013 0435   BUN 15.2 07/11/2013 0921   BUN 12 03/17/2013 0435   CREATININE 1.6* 07/11/2013 0921   CREATININE 1.48* 03/17/2013 0435      Component Value Date/Time   CALCIUM 9.6 07/11/2013 0921   CALCIUM 9.2 03/17/2013 0435   ALKPHOS 76 07/11/2013 0921   AST 11 07/11/2013 0921   ALT 10 07/11/2013 0921   BILITOT 0.76 07/11/2013 0921     EXAM:  CT CHEST, ABDOMEN, AND PELVIS WITH CONTRAST  TECHNIQUE:  Multidetector CT imaging of the chest, abdomen and pelvis was  performed following the standard protocol during bolus  administration of intravenous contrast.  CONTRAST: 162mL OMNIPAQUE IOHEXOL 300 MG/ML SOLN  COMPARISON: 02/16/2013  FINDINGS:  CT CHEST FINDINGS  The chest wall is unremarkable. A right-sided Port-A-Cath is in  place. Small supraclavicular and axillary lymph nodes but no mass or  adenopathy. The thyroid gland appears normal. The bony thorax is  intact. Destructive bone lesions or spinal canal compromise.  The heart is normal in size. No pericardial effusion. No mediastinal  or hilar mass or adenopathy. Scattered atherosclerotic  calcifications but no dissection. The branch vessels are patent. The  esophagus is grossly normal.  Examination of the lung parenchyma demonstrates no acute pulmonary  findings. No worrisome pulmonary nodules to suggest metastatic  disease. A few small scattered subpleural densities are likely small  lymph nodes or subpleural atelectasis. No pleural effusion. No   bronchiectasis or interstitial lung disease.  CT ABDOMEN AND PELVIS FINDINGS  The liver is unremarkable. No metastatic lesions. No biliary  dilatation. The gallbladder is normal. No common bowel duct  dilatation. The pancreas is unremarkable. The spleen is normal.  The adrenal glands are normal. There is a right-sided externalized  nephrostomy tube. No hydronephrosis. No renal mass lesions. Minimal  residual right sided hydroureter is noted.  The stomach, duodenum, small bowel and colon are unremarkable. No  inflammatory changes, mass lesions or obstructive findings. There is  a large amount of stool throughout the colon which may suggest  constipation. No mesenteric or retroperitoneal mass or  lymphadenopathy. Small scattered lymph nodes are stable. Stable  aortic and iliac artery calcifications without focal aneurysm. The  major aortic branch vessels are patent.  The right-sided bladder wall mass is smaller. It measures a maximum  of 4.5 x 2.6 cm and previously measured 5.0 x 3.2 cm. Persistent  involvement of the right ureter is suggested. No enlarged pelvic  lymph nodes  or inguinal lymph nodes. Stable borderline right pelvic  lymph node on image number 105 measuring 8.5 x 6.2 mm.  High attenuation material layering in the dependent portion of the  bladder could represent hemorrhage.  The bony structures are intact. No destructive bone lesions.  IMPRESSION:  1. No CT findings for metastatic disease involving the chest.  2. Slight interval decrease in size of the right-sided bladder  neoplasm. Persistent involvement of the distal right ureter and  probable perivesical involvement without discrete pelvic  lymphadenopathy. Stable borderline right pelvic lymph node on image  number 105 measuring 8.5 x 6.2 mm.  3. High attenuation material layering in the bladder could represent  hemorrhage.  4. Right-sided nephrostomy tube in place without complicating  features.    Impression  and Plan:   63 year old gentleman with the following issues:  1. 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 After a multidisciplinary discussion including input from radiation oncology as well as urology the consensus is to proceed with neoadjuvant chemotherapy.  He completed 3 cycles of chemotherapy without any major complications. His laboratory data and CT scans done on 07/11/2013 were discussed and showed no evidence of metastatic disease with interval decrease in the size of his right-sided bladder neoplasm. My recommendation is to proceed with surgical resection as planned on January 30.  2. IV access: PAC is in place and will be flushed in the future.    3. Acute renal failure: This has resolved. He has a percutaneous nephrostomy tube and we are checking his kidney function again today.   4. Follow-up: He will return after his recovery from surgery anticipating around the first week of March.  PNTIRW,ERXVQ 1/2/20159:53 AM

## 2013-07-21 ENCOUNTER — Encounter: Payer: Self-pay | Admitting: Oncology

## 2013-08-09 ENCOUNTER — Encounter (HOSPITAL_COMMUNITY): Payer: Self-pay | Admitting: Pharmacy Technician

## 2013-08-10 ENCOUNTER — Other Ambulatory Visit: Payer: Self-pay

## 2013-08-10 ENCOUNTER — Inpatient Hospital Stay (HOSPITAL_COMMUNITY)
Admission: RE | Admit: 2013-08-10 | Discharge: 2013-08-17 | DRG: 654 | Disposition: A | Discharge status: Home / Self Care | Payer: Commercial Managed Care - PPO | Source: Ambulatory Visit | Attending: Urology | Admitting: Urology

## 2013-08-10 ENCOUNTER — Encounter (HOSPITAL_COMMUNITY): Payer: Self-pay | Admitting: *Deleted

## 2013-08-10 ENCOUNTER — Inpatient Hospital Stay (HOSPITAL_COMMUNITY): Admission: RE | Admit: 2013-08-10 | Payer: Commercial Managed Care - PPO | Source: Ambulatory Visit | Admitting: Urology

## 2013-08-10 DIAGNOSIS — I10 Essential (primary) hypertension: Secondary | ICD-10-CM | POA: Diagnosis present

## 2013-08-10 DIAGNOSIS — Z7982 Long term (current) use of aspirin: Secondary | ICD-10-CM

## 2013-08-10 DIAGNOSIS — N529 Male erectile dysfunction, unspecified: Secondary | ICD-10-CM | POA: Diagnosis present

## 2013-08-10 DIAGNOSIS — I428 Other cardiomyopathies: Secondary | ICD-10-CM | POA: Diagnosis present

## 2013-08-10 DIAGNOSIS — E43 Unspecified severe protein-calorie malnutrition: Secondary | ICD-10-CM | POA: Insufficient documentation

## 2013-08-10 DIAGNOSIS — N139 Obstructive and reflux uropathy, unspecified: Secondary | ICD-10-CM | POA: Diagnosis present

## 2013-08-10 DIAGNOSIS — N289 Disorder of kidney and ureter, unspecified: Secondary | ICD-10-CM | POA: Diagnosis present

## 2013-08-10 DIAGNOSIS — C679 Malignant neoplasm of bladder, unspecified: Principal | ICD-10-CM | POA: Diagnosis present

## 2013-08-10 DIAGNOSIS — IMO0002 Reserved for concepts with insufficient information to code with codable children: Secondary | ICD-10-CM

## 2013-08-10 DIAGNOSIS — Z9221 Personal history of antineoplastic chemotherapy: Secondary | ICD-10-CM

## 2013-08-10 DIAGNOSIS — Z87891 Personal history of nicotine dependence: Secondary | ICD-10-CM

## 2013-08-10 DIAGNOSIS — Z8249 Family history of ischemic heart disease and other diseases of the circulatory system: Secondary | ICD-10-CM

## 2013-08-10 DIAGNOSIS — Z8042 Family history of malignant neoplasm of prostate: Secondary | ICD-10-CM

## 2013-08-10 DIAGNOSIS — K560 Paralytic ileus: Secondary | ICD-10-CM | POA: Diagnosis not present

## 2013-08-10 DIAGNOSIS — E785 Hyperlipidemia, unspecified: Secondary | ICD-10-CM | POA: Diagnosis present

## 2013-08-10 DIAGNOSIS — E46 Unspecified protein-calorie malnutrition: Secondary | ICD-10-CM | POA: Diagnosis present

## 2013-08-10 DIAGNOSIS — K929 Disease of digestive system, unspecified: Secondary | ICD-10-CM | POA: Diagnosis not present

## 2013-08-10 LAB — COMPREHENSIVE METABOLIC PANEL
ALK PHOS: 67 U/L (ref 39–117)
ALT: 9 U/L (ref 0–53)
AST: 13 U/L (ref 0–37)
Albumin: 3.8 g/dL (ref 3.5–5.2)
BUN: 15 mg/dL (ref 6–23)
CALCIUM: 9.2 mg/dL (ref 8.4–10.5)
CO2: 28 meq/L (ref 19–32)
Chloride: 102 mEq/L (ref 96–112)
Creatinine, Ser: 1.24 mg/dL (ref 0.50–1.35)
GFR calc Af Amer: 69 mL/min — ABNORMAL LOW (ref >90)
GFR calc non Af Amer: 60 mL/min — ABNORMAL LOW (ref >90)
Glucose, Bld: 102 mg/dL — ABNORMAL HIGH (ref 70–99)
POTASSIUM: 3.9 meq/L (ref 3.7–5.3)
SODIUM: 142 meq/L (ref 137–147)
TOTAL PROTEIN: 7.1 g/dL (ref 6.0–8.3)
Total Bilirubin: 0.8 mg/dL (ref 0.3–1.2)

## 2013-08-10 LAB — CBC
HCT: 34.3 % — ABNORMAL LOW (ref 39.0–52.0)
HEMOGLOBIN: 11.3 g/dL — AB (ref 13.0–17.0)
MCH: 28.2 pg (ref 26.0–34.0)
MCHC: 32.9 g/dL (ref 30.0–36.0)
MCV: 85.5 fL (ref 78.0–100.0)
Platelets: 162 10*3/uL (ref 150–400)
RBC: 4.01 MIL/uL — ABNORMAL LOW (ref 4.22–5.81)
RDW: 13.9 % (ref 11.5–15.5)
WBC: 5.9 10*3/uL (ref 4.0–10.5)

## 2013-08-10 LAB — SURGICAL PCR SCREEN
MRSA, PCR: NEGATIVE
Staphylococcus aureus: NEGATIVE

## 2013-08-10 LAB — TYPE AND SCREEN
ABO/RH(D): O POS
Antibody Screen: NEGATIVE

## 2013-08-10 LAB — ABO/RH: ABO/RH(D): O POS

## 2013-08-10 MED ORDER — ALVIMOPAN 12 MG PO CAPS
12.0000 mg | ORAL_CAPSULE | Freq: Once | ORAL | Status: DC
  Filled 2013-08-10: qty 1

## 2013-08-10 MED ORDER — CARVEDILOL 12.5 MG PO TABS
12.5000 mg | ORAL_TABLET | Freq: Two times a day (BID) | ORAL | Status: DC
Start: 2013-08-10 — End: 2013-08-17
  Administered 2013-08-10 – 2013-08-17 (×12): 12.5 mg via ORAL
  Filled 2013-08-10 (×20): qty 1

## 2013-08-10 MED ORDER — FLEET ENEMA 7-19 GM/118ML RE ENEM
1.0000 | ENEMA | Freq: Once | RECTAL | Status: AC
  Administered 2013-08-10: 1 via RECTAL
  Filled 2013-08-10: qty 1

## 2013-08-10 MED ORDER — ALVIMOPAN 12 MG PO CAPS
12.0000 mg | ORAL_CAPSULE | Freq: Once | ORAL | Status: AC
  Administered 2013-08-11: 12 mg via ORAL
  Filled 2013-08-10: qty 1

## 2013-08-10 MED ORDER — SODIUM CHLORIDE 0.9 % IJ SOLN
10.0000 mL | INTRAMUSCULAR | Status: DC | PRN
  Administered 2013-08-13: 30 mL
  Administered 2013-08-14 – 2013-08-16 (×4): 10 mL
  Administered 2013-08-16: 3 mL
  Administered 2013-08-17: 10 mL

## 2013-08-10 MED ORDER — SODIUM CHLORIDE 0.9 % IJ SOLN
10.0000 mL | Freq: Two times a day (BID) | INTRAMUSCULAR | Status: DC
  Administered 2013-08-12 (×2): 20 mL
  Administered 2013-08-16 (×2): 10 mL

## 2013-08-10 MED ORDER — AMLODIPINE BESYLATE 10 MG PO TABS
10.0000 mg | ORAL_TABLET | Freq: Every morning | ORAL | Status: DC
  Administered 2013-08-12 – 2013-08-17 (×6): 10 mg via ORAL
  Filled 2013-08-10 (×7): qty 1

## 2013-08-10 MED ORDER — PIPERACILLIN-TAZOBACTAM 3.375 G IVPB 30 MIN
3.3750 g | INTRAVENOUS | Status: AC
  Administered 2013-08-11: 3.375 g via INTRAVENOUS
  Filled 2013-08-10: qty 50

## 2013-08-10 MED ORDER — KCL IN DEXTROSE-NACL 20-5-0.45 MEQ/L-%-% IV SOLN
INTRAVENOUS | Status: DC
  Administered 2013-08-10: 22:00:00 via INTRAVENOUS
  Filled 2013-08-10 (×2): qty 1000

## 2013-08-10 MED ORDER — PIPERACILLIN-TAZOBACTAM 3.375 G IVPB 30 MIN
3.3750 g | Freq: Once | INTRAVENOUS | Status: DC
  Filled 2013-08-10: qty 50

## 2013-08-10 MED ORDER — HYDROCHLOROTHIAZIDE 25 MG PO TABS
25.0000 mg | ORAL_TABLET | Freq: Every morning | ORAL | Status: DC
Start: 2013-08-11 — End: 2013-08-17
  Administered 2013-08-12 – 2013-08-17 (×6): 25 mg via ORAL
  Filled 2013-08-10 (×7): qty 1

## 2013-08-10 MED ORDER — PEG 3350-KCL-NA BICARB-NACL 420 G PO SOLR
4000.0000 mL | Freq: Once | ORAL | Status: AC
  Administered 2013-08-10: 13:00:00 4000 mL via ORAL

## 2013-08-10 MED ORDER — SIMVASTATIN 10 MG PO TABS
10.0000 mg | ORAL_TABLET | Freq: Every day | ORAL | Status: DC
  Administered 2013-08-12 – 2013-08-16 (×5): 10 mg via ORAL
  Filled 2013-08-10 (×7): qty 1

## 2013-08-10 NOTE — H&P (Signed)
Kenneth Lawrence is an 63 y.o. male.    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 T4 staging CT --> neoadjuvant chemo 3 cycles Gem/Cis. 06/2013 - restaging imaging with some local down-sizing, no gross progression  2 - Renal Insuficiency - Cr 1.5-1.6 range 2014, CT with Rt hydro to level of large rt sided bladder mass w/o obvious ureteral primary by imaging. Now s/p rt neph tube 03/16/2013, changed 06/2013.  PMH sig for HTN. No CAD/MI. No strong blood thinnners. Only prior surgery transurethral resections.  Today Kenneth Lawrence is seen as pre-admission prior to cystectomy tomorrow. No interval fevers.  Past Medical History  Diagnosis Date  . Hypertension   . Bladder mass   . Prostate enlargement   . Hyperlipidemia   . Headache(784.0)     hx. migraines  . Cancer     bladder cancer-hx. of past tumors  . Bladder cancer     25 yr history  . LV dysfunction     resolved by 2012 stress test. EF 52%  . Erectile dysfunction   . Nonspecific abnormal unspecified cardiovascular function study     EF 52% in 10/12. Basal inferior wall defect. 7:45 on treadmill.  . Cardiomyopathy     Result    Past Surgical History  Procedure Laterality Date  . Bladder tumor excision      Multiple times , none in 10 yrs  . Transurethral resection of bladder tumor N/A 03/16/2013    Procedure: TRANSURETHRAL RESECTION OF BLADDER TUMOR (TURBT);  Surgeon: Ailene Rud, MD;  Location: [facility];  Service: Urology;  Laterality: N/A;    Family History  Problem Relation Age of Onset  . Cancer Mother     breast  . Cancer Father     brain  . Heart disease Father   . Stroke Father   . Prostate cancer Father   . Cancer  Sister     breast in 1 sister   Social History:  reports that he quit smoking about 4 months ago. His smoking use included Cigarettes. He smoked 0.50 packs per day. He does not have any smokeless tobacco history on file. He reports that he drinks alcohol. He reports that he does not use illicit drugs.  Allergies: No Known Allergies  Medications Prior to Admission  Medication Sig Dispense Refill  . amLODipine (NORVASC) 10 MG tablet Take 10 mg by mouth every morning.      . Ascorbic Acid (VITAMIN C) 1000 MG tablet Take 1,000 mg by mouth 2 (two) times daily.      Marland Kitchen aspirin EC 81 MG tablet Take 81 mg by mouth every morning.       . carvedilol (COREG) 12.5 MG tablet Take 12.5 mg by mouth 2 (two) times daily with a meal.      . cholecalciferol (VITAMIN D) 1000 UNITS tablet Take 1,000 Units by mouth 2 (two) times daily.       . fish oil-omega-3 fatty acids 1000 MG capsule Take 2 g by mouth daily.      . hydrochlorothiazide (HYDRODIURIL) 25 MG tablet Take 25 mg by mouth every morning.      . lidocaine-prilocaine (EMLA) cream Apply 1 application topically daily as  needed. port-cath      . ondansetron (ZOFRAN) 8 MG tablet Take 8 mg by mouth every 8 (eight) hours as needed for nausea or vomiting.      . potassium chloride SA (K-DUR,KLOR-CON) 20 MEQ tablet Take 20 mEq by mouth daily.      . pravastatin (PRAVACHOL) 20 MG tablet Take 20 mg by mouth every morning.      Marland Kitchen PRESCRIPTION MEDICATION Gemcitabine HCl (GEMZAR) 1,938 mg in sodium chloride 0.9 % 100 mL chemo infusion 1,000 mg/m2  1.92 m2 (Treatment Plan Actual)  Once 06/16/2013        No results found for this or any previous visit (from the past 48 hour(s)). No results found.  Review of Systems  Constitutional: Negative.  Negative for fever, chills, weight loss and malaise/fatigue.  HENT: Negative.   Eyes: Negative.   Respiratory: Negative.   Cardiovascular: Negative.   Gastrointestinal: Negative.   Genitourinary: Negative.    Musculoskeletal: Negative.   Skin: Negative.   Neurological: Negative.   Endo/Heme/Allergies: Negative.   Psychiatric/Behavioral: Negative.     Blood pressure 127/60, pulse 64, temperature 98.1 F (36.7 C), temperature source Oral, resp. rate 20, height 5\' 9"  (1.753 m), weight 71.124 kg (156 lb 12.8 oz), SpO2 100.00%. Physical Exam  Constitutional: He is oriented to person, place, and time. He appears well-developed and well-nourished.  HENT:  Head: Normocephalic and atraumatic.  Eyes: EOM are normal. Pupils are equal, round, and reactive to light.  Neck: Normal range of motion. Neck supple.  Cardiovascular: Normal rate.   Respiratory: Effort normal.  GI: Soft. Bowel sounds are normal.  Non-obese, no scars  Genitourinary: Penis normal.  Musculoskeletal: Normal range of motion.  Neurological: He is alert and oriented to person, place, and time.  Skin: Skin is warm and dry.  Psychiatric: He has a normal mood and affect. His behavior is normal. Judgment and thought content normal.     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  He is s/p neoadjuvant chemo with some likely local downstaging. I feel most oncologically sound way to proceed would be radical cystoprostatectomy with ileal contuit diversion (not candidate for neobladdder given locally advance disease and renal insuficiency).  We rediscussed the role of radical cystectomy + lymph node dissection with concomitant prostatectomy in male and hysterectomy / oophorectomy in male and ileal conduit urinary diversion with the overall goal of complete surgical excision (negative margins) and better staging / diagnosis. We specifically discussed alternatives including chemo-radiation, palliative therapies, and the role of neoadjuvant chemotherapy. We then discussed  surgical approaches including robotic and open techniques with robotic associated with a shorter convalescence. I showed the patient on their abdomen the approximately 4-6 incision (trocar) sites as well as presumed extraction sites with robotic approach as well as possible open incision sites. I also showed them potential sites for the ileal conduit and spent significant time explaining the "plumbing" of this with regards to GI and GU tracts and specific risks of diversion including ureteral stricture. We specifically addressed that there may be need to alter operative plans according to intraopertive findings including conversion to open procedure. We discussed specific peri-operative risks including bleeding, infection, deep vein thrombosis, pulmonary embolism, compartment syndrome, nuropathy / neuropraxia, bowel leak, bowel stricture, heart attack, stroke, death, as well as long-term risks such as non-cure / need for additional therapy  and need for imaging and lab based post-op surveillance protocols. We discussed typical hospital course of approximately 5-7 day hospitalization, need for peri-operative drains / catheters, and typical post-hospital course with return to most non-strenuous activities by 4 weeks and ability to return to most jobs and more strenuous activity such as exercise by 8 weeks.   CMP, CBC, Type + Screen, IVF, Bowel Prep, Entereg, NPO p MN.  2 - Renal Insufficiency - Likely multifactorial with medical renal and rt obstructive component. Stable, hopefully this will remain stable post-op.    Kenneth Lawrence 08/10/2013, 12:19 PM

## 2013-08-10 NOTE — Consult Note (Signed)
WOC ostomy consult note:  Preoperative stoma site selection per Dr. Zettie Pho request Patient seen preoperatively for preoperative stoma site selection.  Schedule for OR in am.  Patient's abdomen assessed in the standing, sitting and lying positions and it is noted where he wears his waistband. Stoma site selected is 6cm to the right of the umbilicus and 0.0TM above in the RUQ.  It is made with a surgical skin marking pen and covered with a thin film transparent dressing. Patient understands the role of the [place] nurse in ostomy care post operatively and welcomes the support and guidance in the immediate days to come as it pertains to ostomy care.  Patient states that his wife and mother-in-law will be here with him during surgery and that his two children will be coming afterwards. [place] nursing team will follow, will remain available to this patient, the nursing and surgical teams.  Thanks, Maudie Flakes, MSN, RN, [place], [place], [place] [phone])

## 2013-08-11 ENCOUNTER — Encounter (HOSPITAL_COMMUNITY): Payer: Self-pay | Admitting: Certified Registered Nurse Anesthetist

## 2013-08-11 ENCOUNTER — Encounter (HOSPITAL_COMMUNITY): Payer: Commercial Managed Care - PPO | Admitting: Certified Registered Nurse Anesthetist

## 2013-08-11 ENCOUNTER — Encounter (HOSPITAL_COMMUNITY)
Admission: RE | Disposition: A | Discharge status: Home / Self Care | Payer: Self-pay | Source: Ambulatory Visit | Attending: Urology

## 2013-08-11 ENCOUNTER — Inpatient Hospital Stay (HOSPITAL_COMMUNITY): Payer: Commercial Managed Care - PPO | Admitting: Certified Registered Nurse Anesthetist

## 2013-08-11 HISTORY — PX: ROBOT ASSISTED LAPAROSCOPIC COMPLETE CYSTECT ILEAL CONDUIT: SHX5139

## 2013-08-11 HISTORY — PX: LYMPHADENECTOMY: SHX5960

## 2013-08-11 LAB — CBC
HCT: 32.4 % — ABNORMAL LOW (ref 39.0–52.0)
HEMOGLOBIN: 10.9 g/dL — AB (ref 13.0–17.0)
MCH: 28.2 pg (ref 26.0–34.0)
MCHC: 33.6 g/dL (ref 30.0–36.0)
MCV: 83.9 fL (ref 78.0–100.0)
PLATELETS: 166 10*3/uL (ref 150–400)
RBC: 3.86 MIL/uL — AB (ref 4.22–5.81)
RDW: 13.6 % (ref 11.5–15.5)
WBC: 13 10*3/uL — AB (ref 4.0–10.5)

## 2013-08-11 LAB — BASIC METABOLIC PANEL
BUN: 16 mg/dL (ref 6–23)
CO2: 23 mEq/L (ref 19–32)
Calcium: 8.8 mg/dL (ref 8.4–10.5)
Chloride: 101 mEq/L (ref 96–112)
Creatinine, Ser: 1.59 mg/dL — ABNORMAL HIGH (ref 0.50–1.35)
GFR calc Af Amer: 51 mL/min — ABNORMAL LOW (ref >90)
GFR, EST NON AFRICAN AMERICAN: 44 mL/min — AB (ref >90)
GLUCOSE: 183 mg/dL — AB (ref 70–99)
POTASSIUM: 3.5 meq/L — AB (ref 3.7–5.3)
SODIUM: 139 meq/L (ref 137–147)

## 2013-08-11 SURGERY — ROBOTIC ASSISTED LAPAROSCOPIC COMPLETE CYSTECT ILEAL CONDUIT
Anesthesia: General

## 2013-08-11 MED ORDER — HEPARIN SODIUM (PORCINE) 5000 UNIT/ML IJ SOLN
5000.0000 [IU] | Freq: Three times a day (TID) | INTRAMUSCULAR | Status: DC
  Administered 2013-08-11 – 2013-08-17 (×17): 5000 [IU] via SUBCUTANEOUS
  Filled 2013-08-11 (×20): qty 1

## 2013-08-11 MED ORDER — DEXAMETHASONE SODIUM PHOSPHATE 10 MG/ML IJ SOLN
INTRAMUSCULAR | Status: DC | PRN
  Administered 2013-08-11: 10 mg via INTRAVENOUS

## 2013-08-11 MED ORDER — SODIUM CHLORIDE 0.9 % IJ SOLN: 9.0000 mL | INTRAMUSCULAR | Status: DC | PRN

## 2013-08-11 MED ORDER — ALVIMOPAN 12 MG PO CAPS
12.0000 mg | ORAL_CAPSULE | Freq: Two times a day (BID) | ORAL | Status: DC
  Administered 2013-08-12 – 2013-08-14 (×6): 12 mg via ORAL
  Filled 2013-08-11 (×8): qty 1

## 2013-08-11 MED ORDER — LIDOCAINE HCL (CARDIAC) 20 MG/ML IV SOLN
INTRAVENOUS | Status: DC | PRN
  Administered 2013-08-11: 70 mg via INTRAVENOUS

## 2013-08-11 MED ORDER — EPHEDRINE SULFATE 50 MG/ML IJ SOLN
INTRAMUSCULAR | Status: DC | PRN
  Administered 2013-08-11 (×2): 5 mg via INTRAVENOUS
  Administered 2013-08-11: 10 mg via INTRAVENOUS
  Administered 2013-08-11 (×2): 5 mg via INTRAVENOUS
  Administered 2013-08-11: 10 mg via INTRAVENOUS

## 2013-08-11 MED ORDER — MIDAZOLAM HCL 2 MG/2ML IJ SOLN
INTRAMUSCULAR | Status: AC
  Filled 2013-08-11: qty 2

## 2013-08-11 MED ORDER — SODIUM CHLORIDE 0.9 % IJ SOLN
INTRAMUSCULAR | Status: AC
  Filled 2013-08-11: qty 50

## 2013-08-11 MED ORDER — PHENYLEPHRINE HCL 10 MG/ML IJ SOLN
INTRAMUSCULAR | Status: AC
  Filled 2013-08-11: qty 1

## 2013-08-11 MED ORDER — BUPIVACAINE-EPINEPHRINE PF 0.25-1:200000 % IJ SOLN
INTRAMUSCULAR | Status: AC
  Filled 2013-08-11: qty 30

## 2013-08-11 MED ORDER — SUCCINYLCHOLINE CHLORIDE 20 MG/ML IJ SOLN
INTRAMUSCULAR | Status: DC | PRN
  Administered 2013-08-11: 100 mg via INTRAVENOUS

## 2013-08-11 MED ORDER — PROPOFOL 10 MG/ML IV BOLUS
INTRAVENOUS | Status: AC
  Filled 2013-08-11: qty 20

## 2013-08-11 MED ORDER — HYDROMORPHONE HCL PF 1 MG/ML IJ SOLN
INTRAMUSCULAR | Status: DC | PRN
  Administered 2013-08-11 (×2): 0.5 mg via INTRAVENOUS
  Administered 2013-08-11: 1 mg via INTRAVENOUS

## 2013-08-11 MED ORDER — BUPIVACAINE 0.25 % ON-Q PUMP SINGLE CATH 300ML
300.0000 mL | INJECTION | Status: DC
  Filled 2013-08-11: qty 300

## 2013-08-11 MED ORDER — LACTATED RINGERS IR SOLN
Status: DC | PRN
  Administered 2013-08-11: 1000 mL

## 2013-08-11 MED ORDER — NALOXONE HCL 0.4 MG/ML IJ SOLN: 0.4000 mg | INTRAMUSCULAR | Status: DC | PRN

## 2013-08-11 MED ORDER — BUPIVACAINE ON-Q PAIN PUMP (FOR ORDER SET NO CHG)
INJECTION | Status: AC
  Filled 2013-08-11: qty 1

## 2013-08-11 MED ORDER — LIDOCAINE HCL (CARDIAC) 20 MG/ML IV SOLN
INTRAVENOUS | Status: AC
  Filled 2013-08-11: qty 5

## 2013-08-11 MED ORDER — FENTANYL CITRATE 0.05 MG/ML IJ SOLN
INTRAMUSCULAR | Status: AC
  Filled 2013-08-11: qty 5

## 2013-08-11 MED ORDER — FENTANYL CITRATE 0.05 MG/ML IJ SOLN
INTRAMUSCULAR | Status: AC
  Filled 2013-08-11: qty 2

## 2013-08-11 MED ORDER — NEOSTIGMINE METHYLSULFATE 1 MG/ML IJ SOLN
INTRAMUSCULAR | Status: DC | PRN
  Administered 2013-08-11: 4 mg via INTRAVENOUS

## 2013-08-11 MED ORDER — PIPERACILLIN-TAZOBACTAM 3.375 G IVPB
3.3750 g | Freq: Three times a day (TID) | INTRAVENOUS | Status: DC
  Administered 2013-08-11 – 2013-08-14 (×8): 3.375 g via INTRAVENOUS
  Filled 2013-08-11 (×10): qty 50

## 2013-08-11 MED ORDER — KCL IN DEXTROSE-NACL 20-5-0.45 MEQ/L-%-% IV SOLN
INTRAVENOUS | Status: DC
  Administered 2013-08-11 – 2013-08-13 (×6): via INTRAVENOUS
  Administered 2013-08-14: 1000 mL via INTRAVENOUS
  Administered 2013-08-14 – 2013-08-15 (×4): via INTRAVENOUS
  Filled 2013-08-11 (×15): qty 1000

## 2013-08-11 MED ORDER — ROCURONIUM BROMIDE 100 MG/10ML IV SOLN
INTRAVENOUS | Status: AC
  Filled 2013-08-11: qty 1

## 2013-08-11 MED ORDER — HYDROMORPHONE HCL PF 2 MG/ML IJ SOLN
INTRAMUSCULAR | Status: AC
  Filled 2013-08-11: qty 1

## 2013-08-11 MED ORDER — PHENYLEPHRINE 40 MCG/ML (10ML) SYRINGE FOR IV PUSH (FOR BLOOD PRESSURE SUPPORT)
PREFILLED_SYRINGE | INTRAVENOUS | Status: AC
  Filled 2013-08-11: qty 10

## 2013-08-11 MED ORDER — EPHEDRINE SULFATE 50 MG/ML IJ SOLN
INTRAMUSCULAR | Status: AC
  Filled 2013-08-11: qty 1

## 2013-08-11 MED ORDER — LACTATED RINGERS IV SOLN
INTRAVENOUS | Status: DC | PRN
  Administered 2013-08-11 (×3): via INTRAVENOUS

## 2013-08-11 MED ORDER — DIPHENHYDRAMINE HCL 12.5 MG/5ML PO ELIX: 12.5000 mg | ORAL_SOLUTION | Freq: Four times a day (QID) | ORAL | Status: DC | PRN

## 2013-08-11 MED ORDER — ONDANSETRON HCL 4 MG/2ML IJ SOLN
INTRAMUSCULAR | Status: AC
  Filled 2013-08-11: qty 2

## 2013-08-11 MED ORDER — ONDANSETRON HCL 4 MG/2ML IJ SOLN
INTRAMUSCULAR | Status: DC | PRN
  Administered 2013-08-11: 4 mg via INTRAVENOUS

## 2013-08-11 MED ORDER — STERILE WATER FOR IRRIGATION IR SOLN
Status: DC | PRN
  Administered 2013-08-11: 3000 mL

## 2013-08-11 MED ORDER — ROCURONIUM BROMIDE 100 MG/10ML IV SOLN
INTRAVENOUS | Status: DC | PRN
  Administered 2013-08-11: 5 mg via INTRAVENOUS
  Administered 2013-08-11 (×2): 10 mg via INTRAVENOUS
  Administered 2013-08-11: 50 mg via INTRAVENOUS
  Administered 2013-08-11 (×3): 10 mg via INTRAVENOUS

## 2013-08-11 MED ORDER — HYDROMORPHONE 0.3 MG/ML IV SOLN
INTRAVENOUS | Status: AC
  Filled 2013-08-11: qty 25

## 2013-08-11 MED ORDER — GLYCOPYRROLATE 0.2 MG/ML IJ SOLN
INTRAMUSCULAR | Status: DC | PRN
  Administered 2013-08-11: .6 mg via INTRAVENOUS
  Administered 2013-08-11: 0.2 mg via INTRAVENOUS

## 2013-08-11 MED ORDER — DOCUSATE SODIUM 100 MG PO CAPS
100.0000 mg | ORAL_CAPSULE | Freq: Two times a day (BID) | ORAL | Status: DC
  Administered 2013-08-12 – 2013-08-17 (×11): 100 mg via ORAL
  Filled 2013-08-11 (×13): qty 1

## 2013-08-11 MED ORDER — FENTANYL CITRATE 0.05 MG/ML IJ SOLN
INTRAMUSCULAR | Status: DC | PRN
  Administered 2013-08-11 (×3): 50 ug via INTRAVENOUS
  Administered 2013-08-11: 100 ug via INTRAVENOUS
  Administered 2013-08-11 (×7): 50 ug via INTRAVENOUS

## 2013-08-11 MED ORDER — HEPARIN SODIUM (PORCINE) 1000 UNIT/ML IJ SOLN
INTRAMUSCULAR | Status: AC
  Filled 2013-08-11: qty 1

## 2013-08-11 MED ORDER — GLYCOPYRROLATE 0.2 MG/ML IJ SOLN
INTRAMUSCULAR | Status: AC
  Filled 2013-08-11: qty 3

## 2013-08-11 MED ORDER — SODIUM CHLORIDE 0.9 % IJ SOLN
INTRAMUSCULAR | Status: AC
  Filled 2013-08-11: qty 10

## 2013-08-11 MED ORDER — CHLORHEXIDINE GLUCONATE CLOTH 2 % EX PADS
6.0000 | MEDICATED_PAD | Freq: Every day | CUTANEOUS | Status: DC
  Administered 2013-08-11 – 2013-08-16 (×3): 6 via TOPICAL

## 2013-08-11 MED ORDER — HYDROMORPHONE HCL PF 1 MG/ML IJ SOLN
INTRAMUSCULAR | Status: AC
  Filled 2013-08-11: qty 1

## 2013-08-11 MED ORDER — PHENYLEPHRINE HCL 10 MG/ML IJ SOLN
INTRAMUSCULAR | Status: DC | PRN
  Administered 2013-08-11 (×8): 80 ug via INTRAVENOUS

## 2013-08-11 MED ORDER — DIPHENHYDRAMINE HCL 50 MG/ML IJ SOLN: 12.5000 mg | Freq: Four times a day (QID) | INTRAMUSCULAR | Status: DC | PRN

## 2013-08-11 MED ORDER — HYDROMORPHONE 0.3 MG/ML IV SOLN
INTRAVENOUS | Status: DC
  Administered 2013-08-11: 17:00:00 via INTRAVENOUS
  Administered 2013-08-11: 3.3 mg via INTRAVENOUS
  Administered 2013-08-12: 1.8 mg via INTRAVENOUS
  Administered 2013-08-12: 1.32 mg via INTRAVENOUS
  Administered 2013-08-12 (×3): 0.3 mg via INTRAVENOUS
  Administered 2013-08-12: 0.6 mg via INTRAVENOUS
  Administered 2013-08-13: 0.9 mg via INTRAVENOUS
  Administered 2013-08-13 (×2): 1.8 mg via INTRAVENOUS
  Administered 2013-08-13: 18:00:00 via INTRAVENOUS
  Administered 2013-08-13 (×2): 0.9 mg via INTRAVENOUS
  Administered 2013-08-13: 0.6 mg via INTRAVENOUS
  Administered 2013-08-14: 22:00:00 via INTRAVENOUS
  Administered 2013-08-14: 0.3 mg via INTRAVENOUS
  Administered 2013-08-14 (×2): 0.9 mg via INTRAVENOUS
  Administered 2013-08-14: 0.6 mg via INTRAVENOUS
  Administered 2013-08-14: 0.9 mg via INTRAVENOUS
  Administered 2013-08-15: 0.6 mg via INTRAVENOUS
  Administered 2013-08-15: 0.9 mg via INTRAVENOUS
  Administered 2013-08-15: 0.3 mg via INTRAVENOUS
  Administered 2013-08-15: 0.6 mg via INTRAVENOUS
  Administered 2013-08-16: 0.3 mg via INTRAVENOUS
  Administered 2013-08-16: 1.2 mg via INTRAVENOUS
  Administered 2013-08-16: 0.3 mg via INTRAVENOUS
  Filled 2013-08-11 (×3): qty 25

## 2013-08-11 MED ORDER — PROMETHAZINE HCL 25 MG/ML IJ SOLN: 6.2500 mg | INTRAMUSCULAR | Status: DC | PRN

## 2013-08-11 MED ORDER — SENNA 8.6 MG PO TABS
1.0000 | ORAL_TABLET | Freq: Two times a day (BID) | ORAL | Status: DC
  Administered 2013-08-12 – 2013-08-17 (×11): 8.6 mg via ORAL
  Filled 2013-08-11 (×11): qty 1

## 2013-08-11 MED ORDER — NEOSTIGMINE METHYLSULFATE 1 MG/ML IJ SOLN
INTRAMUSCULAR | Status: AC
  Filled 2013-08-11: qty 10

## 2013-08-11 MED ORDER — HYDROMORPHONE HCL PF 1 MG/ML IJ SOLN
0.2500 mg | INTRAMUSCULAR | Status: DC | PRN
  Administered 2013-08-11 (×2): 0.5 mg via INTRAVENOUS

## 2013-08-11 MED ORDER — ONDANSETRON HCL 4 MG/2ML IJ SOLN: 4.0000 mg | INTRAMUSCULAR | Status: DC | PRN

## 2013-08-11 MED ORDER — DEXAMETHASONE SODIUM PHOSPHATE 10 MG/ML IJ SOLN
INTRAMUSCULAR | Status: AC
  Filled 2013-08-11: qty 1

## 2013-08-11 MED ORDER — MIDAZOLAM HCL 5 MG/5ML IJ SOLN
INTRAMUSCULAR | Status: DC | PRN
  Administered 2013-08-11 (×2): 1 mg via INTRAVENOUS

## 2013-08-11 MED ORDER — PROPOFOL 10 MG/ML IV BOLUS
INTRAVENOUS | Status: DC | PRN
  Administered 2013-08-11: 180 mg via INTRAVENOUS

## 2013-08-11 MED ORDER — ONDANSETRON HCL 4 MG/2ML IJ SOLN: 4.0000 mg | Freq: Four times a day (QID) | INTRAMUSCULAR | Status: DC | PRN

## 2013-08-11 SURGICAL SUPPLY — 98 items
ADH SKN CLS APL DERMABOND .7 (GAUZE/BANDAGES/DRESSINGS) ×2
APL ESCP 34 STRL LF DISP (HEMOSTASIS)
APPLICATOR COTTON TIP 6IN STRL (MISCELLANEOUS) ×2 IMPLANT
APPLICATOR SURGIFLO ENDO (HEMOSTASIS) ×2 IMPLANT
BAG SPEC RTRVL LRG 6X4 10 (ENDOMECHANICALS)
BAG URO CATCHER STRL LF (DRAPE) ×4 IMPLANT
BLADE SURG SZ10 CARB STEEL (BLADE) ×2 IMPLANT
CABLE HIGH FREQUENCY MONO STRZ (ELECTRODE) ×4 IMPLANT
CANISTER SUCT LVC 12 LTR MEDI- (MISCELLANEOUS) ×2 IMPLANT
CANISTER SUCTION 2500CC (MISCELLANEOUS) ×2 IMPLANT
CATH FOLEY 2WAY SLVR  5CC 16FR (CATHETERS)
CATH FOLEY 2WAY SLVR 5CC 16FR (CATHETERS) ×2 IMPLANT
CATH KIT ON Q 7.5IN SLV (PAIN MANAGEMENT) ×2 IMPLANT
CATH ROBINSON RED A/P 16FR (CATHETERS) ×4 IMPLANT
CHLORAPREP W/TINT 26ML (MISCELLANEOUS) ×4 IMPLANT
CLIP LIGATING HEM O LOK PURPLE (MISCELLANEOUS) ×12 IMPLANT
CLIP LIGATING HEMO LOK XL GOLD (MISCELLANEOUS) ×16 IMPLANT
COVER MAYO STAND STRL (DRAPES) ×6 IMPLANT
COVER SURGICAL LIGHT HANDLE (MISCELLANEOUS) ×4 IMPLANT
COVER TIP SHEARS 8 DVNC (MISCELLANEOUS) ×2 IMPLANT
COVER TIP SHEARS 8MM DA VINCI (MISCELLANEOUS) ×4
DECANTER SPIKE VIAL GLASS SM (MISCELLANEOUS) ×2 IMPLANT
DERMABOND ADVANCED (GAUZE/BANDAGES/DRESSINGS) ×2
DERMABOND ADVANCED .7 DNX12 (GAUZE/BANDAGES/DRESSINGS) ×4 IMPLANT
DRAIN CHANNEL 15F RND FF 3/16 (WOUND CARE) ×2 IMPLANT
DRAPE TABLE BACK 44X90 PK DISP (DRAPES) ×4 IMPLANT
DRAPE WARM FLUID 44X44 (DRAPE) ×44 IMPLANT
DRSG TEGADERM 6X8 (GAUZE/BANDAGES/DRESSINGS) ×8 IMPLANT
ELECT CAUTERY BLADE 6.4 (BLADE) ×2 IMPLANT
ELECT REM PT RETURN 9FT ADLT (ELECTROSURGICAL) ×4
ELECTRODE REM PT RTRN 9FT ADLT (ELECTROSURGICAL) ×2 IMPLANT
EVACUATOR DRAINAGE 7X20 100CC (MISCELLANEOUS) ×2 IMPLANT
EVACUATOR SILICONE 100CC (MISCELLANEOUS)
GLOVE BIO SURGEON STRL SZ 6.5 (GLOVE) ×4 IMPLANT
GLOVE BIO SURGEONS STRL SZ 6.5 (GLOVE) ×2
GLOVE BIOGEL M STRL SZ7.5 (GLOVE) ×26 IMPLANT
GOWN STRL REUS W/TWL XL LVL3 (GOWN DISPOSABLE) ×8 IMPLANT
GUIDEWIRE ANG ZIPWIRE 038X150 (WIRE) IMPLANT
HEMOSTAT SURGICEL 4X8 (HEMOSTASIS) ×2 IMPLANT
HOLDER FOLEY CATH W/STRAP (MISCELLANEOUS) ×4 IMPLANT
KIT ACCESSORY DA VINCI DISP (KITS) ×2
KIT ACCESSORY DVNC DISP (KITS) ×2 IMPLANT
KIT PROCEDURE DA VINCI SI (MISCELLANEOUS) ×2
KIT PROCEDURE DVNC SI (MISCELLANEOUS) ×2 IMPLANT
LOOP MINI RED (MISCELLANEOUS) ×4 IMPLANT
LOOP VESSEL MAXI BLUE (MISCELLANEOUS) ×4 IMPLANT
NDL INSUFFLATION 14GA 120MM (NEEDLE) ×2 IMPLANT
NEEDLE INSUFFLATION 14GA 120MM (NEEDLE) ×4 IMPLANT
PACK CYSTO (CUSTOM PROCEDURE TRAY) ×4 IMPLANT
PACK ROBOT UROLOGY CUSTOM (CUSTOM PROCEDURE TRAY) ×4 IMPLANT
POSITIONER SURGICAL ARM (MISCELLANEOUS) ×4 IMPLANT
POUCH ENDO CATCH II 15MM (MISCELLANEOUS) ×2 IMPLANT
POUCH SPECIMEN RETRIEVAL 10MM (ENDOMECHANICALS) IMPLANT
RELOAD GOLD (STAPLE) ×2 IMPLANT
RELOAD GREEN (STAPLE) ×10 IMPLANT
RELOAD LINEAR CUT PROX 55 BLUE (ENDOMECHANICALS) IMPLANT
RELOAD STAPLE 55 3.8 BLU REG (ENDOMECHANICALS) IMPLANT
RELOAD WHITE ECR60W (STAPLE) ×28 IMPLANT
SET TUBE IRRIG SUCTION NO TIP (IRRIGATION / IRRIGATOR) ×4 IMPLANT
SOLUTION ELECTROLUBE (MISCELLANEOUS) ×4 IMPLANT
SPONGE LAP 18X18 X RAY DECT (DISPOSABLE) ×2 IMPLANT
SPONGE LAP 4X18 X RAY DECT (DISPOSABLE) ×4 IMPLANT
STAPLE ECHEON FLEX 60 POW ENDO (STAPLE) ×6 IMPLANT
STAPLER GUN LINEAR PROX 60 (STAPLE) IMPLANT
STAPLER PROXIMATE 55 BLUE (STAPLE) IMPLANT
STENT SET URETHERAL LEFT 7FR (STENTS) ×4 IMPLANT
STENT SET URETHERAL RIGHT 7FR (STENTS) ×4 IMPLANT
SUCTION FRAZIER 12FR DISP (SUCTIONS) ×2 IMPLANT
SURGIFLO W/THROMBIN 8M KIT (HEMOSTASIS) ×2 IMPLANT
SUT CHROMIC 4 0 RB 1X27 (SUTURE) ×6 IMPLANT
SUT ETHILON 3 0 PS 1 (SUTURE) ×4 IMPLANT
SUT MNCRL AB 4-0 PS2 18 (SUTURE) ×4 IMPLANT
SUT MON AB 5-0 RB1 27 (SUTURE) ×8 IMPLANT
SUT PDS AB 0 CTX 36 PDP370T (SUTURE) ×4 IMPLANT
SUT PDS AB 1 CTX 36 (SUTURE) ×6 IMPLANT
SUT SILK 3 0 SH 30 (SUTURE) ×20 IMPLANT
SUT SILK 3 0 SH CR/8 (SUTURE) ×4 IMPLANT
SUT VIC AB 2-0 UR5 27 (SUTURE) ×4 IMPLANT
SUT VIC AB 2-0 UR6 27 (SUTURE) ×10 IMPLANT
SUT VIC AB 3-0 SH 27 (SUTURE) ×20
SUT VIC AB 3-0 SH 27X BRD (SUTURE) ×4 IMPLANT
SUT VIC AB 3-0 SH 27XBRD (SUTURE) IMPLANT
SUT VIC AB 4-0 PS2 18 (SUTURE) ×4 IMPLANT
SUT VIC AB 4-0 RB1 27 (SUTURE) ×8
SUT VIC AB 4-0 RB1 27XBRD (SUTURE) ×2 IMPLANT
SUT VICRYL 0 UR6 27IN ABS (SUTURE) ×10 IMPLANT
SUT VLOC BARB 180 ABS3/0GR12 (SUTURE) ×4
SUTURE VLOC BRB 180 ABS3/0GR12 (SUTURE) ×2 IMPLANT
SYSTEM UROSTOMY GENTLE TOUCH (WOUND CARE) ×2 IMPLANT
TOWEL NATURAL 10PK STERILE (DISPOSABLE) ×6 IMPLANT
TOWEL OR NON WOVEN STRL DISP B (DISPOSABLE) ×6 IMPLANT
TRAY FOLEY CATH 16FRSI W/METER (SET/KITS/TRAYS/PACK) ×2 IMPLANT
TROCAR 12M 150ML BLUNT (TROCAR) ×4 IMPLANT
TROCAR BLADELESS 15MM (ENDOMECHANICALS) ×4 IMPLANT
URINEMETER 200ML W/220 (MISCELLANEOUS) ×2 IMPLANT
WATER STERILE IRR 1000ML UROMA (IV SOLUTION) ×2 IMPLANT
WATER STERILE IRR 1500ML POUR (IV SOLUTION) ×8 IMPLANT
YANKAUER SUCT BULB TIP 10FT TU (MISCELLANEOUS) ×2 IMPLANT

## 2013-08-11 NOTE — Discharge Instructions (Signed)
1.  Activity:  You are encouraged to ambulate frequently (about every hour during waking hours) to help prevent blood clots from forming in your legs or lungs.  However, you should not engage in any heavy lifting (> 10-15 lbs), strenuous activity, or straining. °2. Diet: You should advance your diet as instructed by your physician.  It will be normal to have some bloating, nausea, and abdominal discomfort intermittently. °3. Prescriptions:  You will be provided a prescription for pain medication to take as needed.  If your pain is not severe enough to require the prescription pain medication, you may take extra strength Tylenol instead which will have less side effects.  You should also take a prescribed stool softener to avoid straining with bowel movements as the prescription pain medication may constipate you. °4. Incisions: You may remove your dressing bandages 48 hours after surgery if not removed in the hospital.  You will either have some small staples or special tissue glue at each of the incision sites. Once the bandages are removed (if present), the incisions may stay open to air.  You may start showering (but not soaking or bathing in water) the 2nd day after surgery and the incisions simply need to be patted dry after the shower.  No additional care is needed. °5. What to call us about: You should call the office ([phone]) if you develop fever > 101 or develop persistent vomiting. ° °You may resume aspirin, vitamins, and supplements 7 days after surgery. °

## 2013-08-11 NOTE — Progress Notes (Signed)
Nutrition Brief Note  Patient identified on the Malnutrition Screening Tool (MST) Report  Wt Readings from Last 15 Encounters:  08/10/13 156 lb 12.8 oz (71.124 kg)  08/10/13 156 lb 12.8 oz (71.124 kg)  07/14/13 160 lb 9.6 oz (72.848 kg)  06/16/13 156 lb 4.8 oz (70.897 kg)  06/09/13 161 lb 3.2 oz (73.12 kg)  05/19/13 166 lb 6.4 oz (75.479 kg)  05/05/13 164 lb 6.4 oz (74.571 kg)  04/25/13 168 lb (76.204 kg)  04/24/13 168 lb (76.204 kg)  04/07/13 166 lb 14.4 oz (75.705 kg)  03/30/13 166 lb 14.4 oz (75.705 kg)  03/22/13 167 lb 4 oz (75.864 kg)  03/16/13 170 lb (77.111 kg)  03/16/13 170 lb (77.111 kg)  03/09/13 171 lb 4 oz (77.678 kg)    Body mass index is 23.14 kg/(m^2). Patient meets criteria for Normal weight based on current BMI.   Patient was identified as MST score 3. Pt in [place] and unable to provide information for nutrition assessment. Plan for ICU room post-op. RD to complete assessment per weekend protocols.  Current diet order is NPO.  Atlee Abide MS RD LDN Clinical Dietitian [phone]

## 2013-08-11 NOTE — Transfer of Care (Signed)
Immediate Anesthesia Transfer of Care Note  Patient: Kenneth Lawrence  Procedure(s) Performed: Procedure(s) (LRB): ROBOTIC ASSISTED LAPAROSCOPIC COMPLETE CYSTECTOMY, PROSTATECTOMY,  ILEAL CONDUIT , ALSO CYSTOSCOPY, INDOCYANINE GREEN DYE INJECTION OF TUMOR (N/A) LYMPHADENECTOMY (Bilateral)  Patient Location: PACU  Anesthesia Type: General  Level of Consciousness: sedated, patient cooperative and responds to stimulation  Airway & Oxygen Therapy: Patient Spontanous Breathing and Patient connected to face mask oxgen  Post-op Assessment: Report given to PACU RN and Post -op Vital signs reviewed and stable  Post vital signs: Reviewed and stable  Complications: No apparent anesthesia complications

## 2013-08-11 NOTE — Preoperative (Signed)
Beta Blockers   Reason not to administer Beta Blockers:Not Applicable, patient took coreg 08/10/13 1723.

## 2013-08-11 NOTE — Anesthesia Preprocedure Evaluation (Signed)
Anesthesia Evaluation  Patient identified by MRN, date of birth, ID band Patient awake    Reviewed: Allergy & Precautions, H&P , NPO status , Patient's Chart, lab work & pertinent test results  Airway Mallampati: II TM Distance: >3 FB Neck ROM: Full    Dental no notable dental hx.    Pulmonary former smoker,  breath sounds clear to auscultation  Pulmonary exam normal       Cardiovascular hypertension, Pt. on medications Rhythm:Regular Rate:Normal     Neuro/Psych negative neurological ROS  negative psych ROS   GI/Hepatic negative GI ROS, Neg liver ROS,   Endo/Other  negative endocrine ROS  Renal/GU negative Renal ROS  negative genitourinary   Musculoskeletal negative musculoskeletal ROS (+)   Abdominal   Peds negative pediatric ROS (+)  Hematology negative hematology ROS (+)   Anesthesia Other Findings   Reproductive/Obstetrics negative OB ROS                           Anesthesia Physical Anesthesia Plan  ASA: III  Anesthesia Plan: General   Post-op Pain Management:    Induction: Intravenous  Airway Management Planned: Oral ETT  Additional Equipment: Arterial line  Intra-op Plan:   Post-operative Plan: Extubation in OR  Informed Consent: I have reviewed the patients History and Physical, chart, labs and discussed the procedure including the risks, benefits and alternatives for the proposed anesthesia with the patient or authorized representative who has indicated his/her understanding and acceptance.   Dental advisory given  Plan Discussed with: CRNA and Surgeon  Anesthesia Plan Comments:         Anesthesia Quick Evaluation

## 2013-08-11 NOTE — Brief Op Note (Signed)
08/10/2013 - 08/11/2013  3:24 PM  PATIENT:  Kenneth Lawrence  63 y.o. male  PRE-OPERATIVE DIAGNOSIS:  BLADDER CANCER  POST-OPERATIVE DIAGNOSIS:  bladder cancer  PROCEDURE:  Procedure(s): ROBOTIC ASSISTED LAPAROSCOPIC COMPLETE CYSTECTOMY, PROSTATECTOMY,  ILEAL CONDUIT , ALSO CYSTOSCOPY, INDOCYANINE GREEN DYE INJECTION OF TUMOR (N/A) LYMPHADENECTOMY (Bilateral)  SURGEON:  Surgeon(s) and Role:    * Alexis Frock, MD - Primary  PHYSICIAN ASSISTANT:   ASSISTANTS: Felipa Furnace, PA   ANESTHESIA:   local and general  EBL:  Total I/O In: 2000 [I.V.:2000] Out: 300 [Blood:300]  BLOOD ADMINISTERED:none  DRAINS: Urostomy with Bander Stents, JP, On-Q pain pump to fasica   LOCAL MEDICATIONS USED:  MARCAINE     SPECIMEN:  Source of Specimen:  1 - Pelvic Lymph Nodes, 2 - Bladder + Prostate, 3- Rt Margins and revised margin  DISPOSITION OF SPECIMEN:  PATHOLOGY  COUNTS:  YES  TOURNIQUET:  * No tourniquets in log *  DICTATION: .Other Dictation: Dictation Number [id number]  PLAN OF CARE: Admit to inpatient   PATIENT DISPOSITION:  PACU - hemodynamically stable.   Delay start of Pharmacological VTE agent (>24hrs) due to surgical blood loss or risk of bleeding: no

## 2013-08-11 NOTE — Progress Notes (Signed)
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 T4 staging CT --> neoadjuvant chemo 3 cycles Gem/Cis.  06/2013 - restaging imaging with some local down-sizing, no gross progression   2 - Renal Insuficiency - Cr 1.5-1.6 range 2014, CT with Rt hydro to level of large rt sided bladder mass w/o obvious ureteral primary by imaging. Now s/p rt neph tube 03/16/2013, changed 06/2013.   PMH sig for HTN. No CAD/MI. No strong blood thinnners. Only prior surgery transurethral resections.   Today Kenneth Lawrence is seen to proceed with cystectomy as planned.   Objective: Vital signs in last 24 hours: Temp:  [97.3 F (36.3 C)-98.1 F (36.7 C)] 98.1 F (36.7 C) (01/30 0524) Pulse Rate:  [53-65] 65 (01/30 0524) Resp:  [18-20] 18 (01/30 0524) BP: (127-135)/(60-72) 130/68 mmHg (01/30 0524) SpO2:  [100 %] 100 % (01/30 0524) Weight:  [71.124 kg (156 lb 12.8 oz)] 71.124 kg (156 lb 12.8 oz) (01/29 1027) Last BM Date: 08/10/13  Intake/Output from previous day: 01/29 0701 - 01/30 0700 In: 1767.5 [P.O.:1220; I.V.:547.5] Out: 300 [Urine:300] Intake/Output this shift:    General appearance: alert, cooperative and appears stated age Head: Normocephalic, without obvious abnormality, atraumatic Eyes: conjunctivae/corneas clear. PERRL, EOM's intact. Fundi benign. Ears: normal TM's and external ear canals both ears Nose: Nares normal. Septum midline. Mucosa normal. No drainage or sinus tenderness. Throat: lips, mucosa, and tongue normal; teeth and gums normal Neck: no adenopathy, no carotid bruit, no JVD, supple, symmetrical, trachea midline and thyroid not enlarged, symmetric, no tenderness/mass/nodules Back: symmetric, no curvature. ROM normal. No CVA  tenderness. Resp: clear to auscultation bilaterally Chest wall: no tenderness Cardio: regular rate and rhythm, S1, S2 normal, no murmur, click, rub or gallop GI: soft, non-tender; bowel sounds normal; no masses,  no organomegaly Male genitalia: normal, Rt neph tube in place with clear urine. Extremities: extremities normal, atraumatic, no cyanosis or edema Pulses: 2+ and symmetric Skin: Skin color, texture, turgor normal. No rashes or lesions Lymph nodes: Cervical, supraclavicular, and axillary nodes normal. Neurologic: Grossly normal  Lab Results:   Recent Labs  08/10/13 2040  WBC 5.9  HGB 11.3*  HCT 34.3*  PLT 162   BMET  Recent Labs  08/10/13 2040  NA 142  K 3.9  CL 102  CO2 28  GLUCOSE 102*  BUN 15  CREATININE 1.24  CALCIUM 9.2   PT/INR No results found for this basename: LABPROT, INR,  in the last 72 hours ABG No results found for this basename: PHART, PCO2, PO2, HCO3,  in the last 72 hours  Studies/Results: No results found.  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 He has very good understanding of risks / benefits. WIll plan for ICU tonight post-op.  2 - Renal Insuficiency - stable at baseline post-chemo.  [facility], Kenneth Lawrence 08/11/2013

## 2013-08-12 LAB — CBC
HEMATOCRIT: 28.7 % — AB (ref 39.0–52.0)
Hemoglobin: 9.3 g/dL — ABNORMAL LOW (ref 13.0–17.0)
MCH: 27.5 pg (ref 26.0–34.0)
MCHC: 32.4 g/dL (ref 30.0–36.0)
MCV: 84.9 fL (ref 78.0–100.0)
PLATELETS: 137 10*3/uL — AB (ref 150–400)
RBC: 3.38 MIL/uL — AB (ref 4.22–5.81)
RDW: 13.7 % (ref 11.5–15.5)
WBC: 13.7 10*3/uL — AB (ref 4.0–10.5)

## 2013-08-12 LAB — BASIC METABOLIC PANEL WITH GFR
BUN: 19 mg/dL (ref 6–23)
CO2: 27 meq/L (ref 19–32)
Calcium: 8.3 mg/dL — ABNORMAL LOW (ref 8.4–10.5)
Chloride: 103 meq/L (ref 96–112)
Creatinine, Ser: 2.04 mg/dL — ABNORMAL HIGH (ref 0.50–1.35)
GFR calc Af Amer: 38 mL/min — ABNORMAL LOW
GFR calc non Af Amer: 33 mL/min — ABNORMAL LOW
Glucose, Bld: 184 mg/dL — ABNORMAL HIGH (ref 70–99)
Potassium: 4.1 meq/L (ref 3.7–5.3)
Sodium: 140 meq/L (ref 137–147)

## 2013-08-12 NOTE — Op Note (Signed)
NAMESAJAD, PENTZ NO.:  [id number]  MEDICAL RECORD NO.:  ID:[id number]  LOCATION:  43                         FACILITY:  [facility]  PHYSICIAN:  Alexis Frock, MD     DATE OF BIRTH:  1951-05-28  DATE OF PROCEDURE: 08/11/2013 DATE OF DISCHARGE:                              OPERATIVE REPORT   DIAGNOSIS:  High-grade bladder cancer.  PROCEDURES: 1. Robotic-assisted laparoscopic cystoprostatectomy with bilateral     pelvic lymphadenectomy (ICG sentinal plus template)and ileal conduit urinary diversion. 2. Implantation of fascial pain pump. 3. Cystoscopy with bladder dye injection.  ASSISTANT:  Leta Baptist, PA  FINDINGS: 1. Significant inflammatory extravesical reaction on the right side     near the right bladder pedicle worrisome for T3 disease, this was     confirmed on frozen section and margins revised. 2. Sentinel lymph nodes by ICG fluorescence imaging on the right     external iliac, left external iliac, left common iliac lymph nodes.  ESTIMATED BLOOD LOSS:  300 mL.  DRAINS: 1. Urostomy, ileal conduit straight drain with banders stents in situ.     Right is red, left is blue. 2. Jackson-Pratt drain to bulb suction. 3. Right nephrostomy tube capped.  INDICATIONS:  Mr. Flight is a 63 year old gentleman with recent history of high-grade muscle invasive bladder cancer.  Staging imaging was performed and revealed no obvious metastatic disease; however, there is only concern for locally advanced disease and some malignant right hydronephrosis and likely after visible extension.  He underwent right nephrostomy tube placement and underwent neoadjuvant chemotherapy with gemcitabine and cisplatin times multiple cycles under the care of Medical Oncology.  Following neoadjuvant chemotherapy, he underwent restaging imaging, which revealed radiographic response and again, no distant disease.  Leukopenia had resolved.  His LFTs were normal. Options were  discussed include proceeding with radical cystectomy with various forms of urinary diversion.  He wished to proceed.  Informed consent was obtained and placed in the medical record.  He was readmitted yesterday for bowel prep and repeat labs.  Informed consent was obtained and placed in the medical record.  PROCEDURE IN DETAIL:  The patient being Kenneth Lawrence, was verified. Procedure being cystoscopy with tumor dye injection and robotic cystectomy with ileal conduit urinary diversion was confirmed. Procedure was carried out.  Time-out was performed.  Intravenous antibiotics were administered.  General endotracheal anesthesia was introduced.  The patient was placed into a low lithotomy position.  His arms were carefully tucked and padded bilaterally.  He was further fashioned on the operating table using 3-inch tape over his chest with appropriate padding.  A test of steep Trendelenburg position was performed and he was found to be suitably positioned.  Next, a sterile field was created by prepping and draping the patient's penis, perineum, and proximal thighs using iodine.  Next, cystourethroscopy was performed using a 24-French injection scope.  Inspection of the anterior and posterior urethra was unremarkable.  Inspection of the urinary bladder revealed large predominantly sessile mass predominantly in the right aspect of the urinary bladder.  The right ureteral orifice was completely obliterated.  Using the injection needle to make submucosal blood of ICG dye, this was  performed circumferentially around the tumor, approximately 1 mL of dye was used in total.  Cystoscope was removed.   Again, new sterile field was created by prepping and draping his xiphoid, abdomen using chlorhexidine gluconate and his penis, perineum, and proximal thighs using iodine once again. Next, Foley catheter was placed for easier straight drain.  A high-flow low-pressure pneumoperitoneum was obtained using  Veress technique in the supraumbilical midline having passed the aspiration and drop test. Next, a 12-mm robotic camera port was placed in this location. Inspection of the peritoneal cavity revealed no significant adhesions and no visceral injury.  Additional ports were then placed as follows; right far lateral 8-mm robotic port, right paramedian 8-mm robotic port, right paramedian 15-mm assistant port at the previously marked conduit site, left paramedian 8-mm robotic port and left far lateral 12-mm assistant port.  Robot was docked and passed through electronic checks. Initial attention was directed at identification and mobilization of the right ureter.  The right ureter was found as of coursed over the right common iliac vessels, it was carefully dissected proximally for distance of 4 cm and distally towards the area of the bladder hiatus.  Towards the area of the bladder hiatus, there was significant inflammatory reaction concerning for T3 disease as mentioned previously.  The ureter was doubly clipped and ligated, marked with a dyed suture.  Frozen section was sent, was negative for carcinoma.  At this point, pelvis was interrogated on the right side and no significant hyperfluorescent nodes were seen.  On the left side, however, multiple fluorescent nodes were seen corresponding with sentinel drainage.  Attention was directed to the identification of the left ureter.  Similarly, the left ureter was found as it coursed across the common iliac vessels circumferentially and mobilized proximally for distance of 4 cm above the iliac vessels and distally towards the area of the bladder hiatus where it was doubly clipped and ligated.  Non-dyed suture was used to tag this.  Frozen section was sent was negative for carcinoma.  Again, the left hemipelvis was inspected under near infrared fluorescence and multiple sentinel nodes were seen as were several lymphatic channels coursing from  the area of the urinary bladder.  It was felt that a complete identification of the sentinel nodes had occurred on this side.  Lymphadenectomy was then carefully performed.  First, fiber fatty tissue over the confines of the left common iliac artery and vein were carefully dissected from the aortic bifurcation towards the area of the iliac bifurcation.  There was a single hyperfluorescent node that was not grossly enlarged in this location.  This suspicion was set aside, labeled on left common iliac lymph node, sentinel.  Similarly, left external iliac group was carefully mobilized, confines being the left iliac bifurcation, left external iliac artery and vein and pelvic sidewall.  This was set aside. Hemostasis was achieved using cold clips.  Similarly, the left obturator pocket was carefully mobilized with confines being the left external iliac vein, obturator nerve, and pelvic sidewall.  Following these maneuvers, all hyperfluorescent sentinel lymph nodes were removed, appropriately labeled.  Attention was then directed at right-sided lymphadenectomy.  Repeat inspection did reveal a single solitary sentinel node on the right side with fluorescent lymphatic channel corresponding to the area of the right external iliac group.  Similarly, right common, right external iliac, right obturator group nodes were carefully dissected and submitted individually with sentinel nodes noted.  Hemostasis was again achieved with cold clips.  The obturator nerve inspected bilaterally  and found to be uninjured.  Next, the previous posterior peritoneal incision was carried across the midline thus creating a posterior peritoneal flap in the area of posterior mid- bladder.  This was carefully mobilized inferiorly towards the apex of the prostate.  Bilateral vas deferens were encountered and ligated during these maneuvers.  This exposed the vascular pedicles of the bladder and prostate bilaterally first on  the left side.  The left vascular pedicle was taken using vascular load stapler.  Left endopelvic fascia was incised further exposing the left prostatic pedicle, which was again taken using vascular load stapler on the right side.  There was significant desmoplastic reaction of the fat around the right lateral bladder and right bladder pedicle, again worrisome for T3 disease.  This was purposely taken very widely.  The vascular stapler could not easily traverse this tissue.  Therefore, it was taken very methodically using cautery scissors.  The point coagulation current and clipping of individual arteries to the bladder on the right side.  The right prostatic pedicle was much less involved with this process and it was taken using vascular load stapler.  Lateral aspect was again taken down.  The right endopelvic fascia was incised.  Then, inspection of the bladder was only being by its anterior and apical attachment.  The median umbilical ligament was transected bilaterally and a space of Retzius was developed from the dome of the bladder towards the base of the prostate.  This exposed the dorsal venous complex, which was controlled with vascular load stapler.  This then exposed the area of the membranous urethra.  Foley catheter was carefully moved back and forth to help identify this.  Membranous urethra was transected coldly. This completely freed up the cystoprostatectomy specimen, which was placed into an extra large endoscopic retrieval bag, a single venous sinus very close to the pubic ramus and dorsal venous complex was oversewn using figure-of-eight Vicryl.  The membranous urethral stump was oversewn using V-Loc suture.  Digital rectal exam was performed using indicator glove and no rectal violation was noted.  Given the worrisome findings on the right side of bladder, frozen section of this area was sent and was positive for carcinoma.  As such, a revised resection was performed  of all fatty tissue, which were previously been overlying the lateral aspect of the bladder inferior to the external iliac vessels and obturator nerve, and this fatty tissue was taken down to the bone and muscle respectively.  Set aside, labeled additional right bladder tissue.  It was felt that no additional tissue could safely be removed in this area and additional final margin was taken from the lateral aspect of this.  These were submitted for permanent pathology as it was felt that frozen section would not change the operative course.  Next, the left ureter was passed retroperitoneally over the area of the aortic bifurcation towards the right hemipelvis and tension-free vicinity of the right ureter.  These were again clipped together at the level of stay sutures and marked with a laparoscopic grasper.  Previous left 12-mm assistant port was closed with fascia using a Carter-Thomason suture passer.  Robot was then undocked.  Next, the patient's midlevel incision was made from the previous camera port site inferiorly for a total distance of approximately 6 cm.  Dissection was carried down to fascia of the peritoneal cavity.  Specimen was taken within this retrieval bag and set aside for permanent pathology.  The Omni-Tract retractor was then deployed, this allowed excellent inspection  of the area of the distal ureters, now on the right hemipelvis as well as the ileocecal junction, which was carefully identified.  Next, a segment of terminal ileum 15 cm in length, 15 cm proximal to the ileocecal junction was taken out of continuity using bowel load stapler.  Distal end having been marked with stay suture. Additional vascular load stapler was taken at the proximal and distal segments taking great care to avoid transection of dominant-appearing vascular arcades.  The conduit segment was retroperitonealized and bowel reanastomosis was performed along the anti-mesenteric border using  bowel load stapler x2.  The acute angle was oversewn with interrupted silk. The free end of anastomosis was oversewn using running silk and then imbricated with a separate suture layer of running silk.  Mesenteric defect was closed using interrupted silk, taking great care to avoid excessive closure.  Conduit segment was laid in proper orientation, again retroperitonealized with the previous distal mark distal and appeared that this was a suitable vascularity in length for the conduit. First, the right ureteral anastomosis was performed.  The right ureter was laid across the butt end of the conduit in lateral location and positioned approximately 1 cm distal to the previous staple line.  The staple line was then taken out of continuity by oversewing the silk. The right ureter margin was reexcised and excess was set aside for permanent pathology, labeled revised, right distal ureteral margin.  The margin was inked.  Next, small approximately 4-mm wedge of bowel serosa was excised from this location as well as down the mucosa.  Four everting mucosal sutures were applied, thus allowing complete visualization circumferentially for ureteral anastomosis, this was performed with a 5-0 anchor suture Monocryl followed by placement of a red-colored Bander stent at 22 cm to the anastomosis and further anastomosis was performed using two separate suture lines of a 5-0 Monocryl.  In identical fashion, the left ureter margin was revised. Anastomosis was performed on the contralateral side of the conduit, it appeared pink and vascular.  Using the dusting technique, however, a blue-colored Bander stent was placed in this location 22 cm to the anastomosis.  This appeared to be grossly watertight and all conduit segments appeared to be suitably positioned and without tension.  The operative field was once again inspected.  There was no visceral injury. The distal end of the conduit was then brought through  the previous 15- mm trocar site after having and dilated to a diameter of two surgeon's fingers and excising additional skin to make the ostomy site quarter- sized in diameter approximately.  This anchored at the level of fascia x4 and then four everting sutures were applied, which full filled an excellent everting of the ureterostomy edges via the midline incision site.  The proximal end of the conduit was once again inspected and all ureteral anastomoses appeared watertight and suitably vascular as did the conduit segment.  The fascia was reapproximated using interrupted figure-of-eight PDS.  A On-Q type fascial pain apparatus was very carefully positioned via separate counter incision of the inferior border of the incision.  The 7.5 cm cannula length was chosen and laid directly upon the fascia.  The Scarpa was reapproximated using running Vicryl above this cannula.  All skin sites were reapproximated using subcuticular Monocryl followed by Dermabond.  Ostomy appliance was placed.  Before incision closure, the previous left paramedian robotic port site was used to bring a Jackson-Pratt drain into the level of the pelvis and connected to bulb suction.  Procedure  was then terminated. The patient tolerated the procedure well.  There were no immediate periprocedural complications.  The patient was taken to postanesthesia care unit with plan for step-down admission.          ______________________________ Alexis Frock, MD     TM/MEDQ  D:  08/11/2013  T:  08/12/2013  Job:  [id number]

## 2013-08-12 NOTE — Progress Notes (Signed)
Patient ID: Kenneth Lawrence, male   DOB: 17-Oct-1950, 63 y.o.   MRN: [id number]  1 Day Post-Op Subjective: Pt doing well.  No complaints.  Denies nausea or pain.  Objective: Vital signs in last 24 hours: Temp:  [97.9 F (36.6 C)-98.2 F (36.8 C)] 98 F (36.7 C) (01/31 0800) Pulse Rate:  [75-96] 85 (01/31 0800) Resp:  [8-28] 16 (01/31 0802) BP: (113-146)/(50-88) 117/54 mmHg (01/31 0800) SpO2:  [92 %-100 %] 100 % (01/31 0802) Arterial Line BP: (121-178)/(64-169) 123/86 mmHg (01/30 1635) Weight:  [70.2 kg (154 lb 12.2 oz)] 70.2 kg (154 lb 12.2 oz) (01/31 0400)  Intake/Output from previous day: 01/30 0701 - 01/31 0700 In: 4425 [I.V.:4325; IV Piggyback:100] Out: 1400 [Urine:750; Drains:350; Blood:300] Intake/Output this shift: Total I/O In: 125 [I.V.:125] Out: 340 [Urine:300; Drains:40]  Physical Exam:  General: Alert and oriented CV: RRR Lungs: Clear Abdomen: Soft, ND, positive BS Incisions: C/D/I Stoma: pink, patent Ext: NT, No erythema  Lab Results:  Recent Labs  08/10/13 2040 08/11/13 1619 08/12/13 0658  HGB 11.3* 10.9* 9.3*  HCT 34.3* 32.4* 28.7*   BMET  Recent Labs  08/11/13 1619 08/12/13 0658  NA 139 140  K 3.5* 4.1  CL 101 103  CO2 23 27  GLUCOSE 183* 184*  BUN 16 19  CREATININE 1.59* 2.04*  CALCIUM 8.8 8.3*     Studies/Results: No results found.  Assessment/Plan: POD# 1 s/p robotic cystoprostatectomy and ileal conduit urinary diversion - Transfer to floor - Ambulate, IS - Continue NPO - Monitor renal function (UOP has been adequate) - Follow Hgb   LOS: 2 days   Everlina Gotts,LES 08/12/2013, 9:32 AM

## 2013-08-13 DIAGNOSIS — E43 Unspecified severe protein-calorie malnutrition: Secondary | ICD-10-CM | POA: Insufficient documentation

## 2013-08-13 LAB — CBC
HEMATOCRIT: 27.6 % — AB (ref 39.0–52.0)
Hemoglobin: 8.9 g/dL — ABNORMAL LOW (ref 13.0–17.0)
MCH: 27.8 pg (ref 26.0–34.0)
MCHC: 32.2 g/dL (ref 30.0–36.0)
MCV: 86.3 fL (ref 78.0–100.0)
PLATELETS: 137 10*3/uL — AB (ref 150–400)
RBC: 3.2 MIL/uL — ABNORMAL LOW (ref 4.22–5.81)
RDW: 13.8 % (ref 11.5–15.5)
WBC: 9 10*3/uL (ref 4.0–10.5)

## 2013-08-13 LAB — BASIC METABOLIC PANEL
BUN: 16 mg/dL (ref 6–23)
CALCIUM: 8 mg/dL — AB (ref 8.4–10.5)
CO2: 26 meq/L (ref 19–32)
CREATININE: 1.96 mg/dL — AB (ref 0.50–1.35)
Chloride: 105 mEq/L (ref 96–112)
GFR calc Af Amer: 40 mL/min — ABNORMAL LOW (ref >90)
GFR calc non Af Amer: 34 mL/min — ABNORMAL LOW (ref >90)
GLUCOSE: 143 mg/dL — AB (ref 70–99)
Potassium: 3.9 mEq/L (ref 3.7–5.3)
Sodium: 141 mEq/L (ref 137–147)

## 2013-08-13 MED ORDER — BISACODYL 10 MG RE SUPP
10.0000 mg | Freq: Once | RECTAL | Status: AC
  Administered 2013-08-13: 10 mg via RECTAL
  Filled 2013-08-13: qty 1

## 2013-08-13 NOTE — Progress Notes (Signed)
Patient ID: Kenneth Lawrence, male   DOB: 02-May-1951, 63 y.o.   MRN: [id number]  2 Days Post-Op Subjective: Pt without complaints.  Pain still well controlled.  He ambulated yesterday without much difficulty.  No nausea or vomiting. No flatus.  Objective: Vital signs in last 24 hours: Temp:  [98.1 F (36.7 C)-98.8 F (37.1 C)] 98.7 F (37.1 C) (02/01 0634) Pulse Rate:  [71-81] 71 (02/01 0634) Resp:  [9-18] 13 (02/01 0759) BP: (109-131)/(50-64) 118/64 mmHg (02/01 0634) SpO2:  [100 %] 100 % (02/01 0759) Weight:  [69.718 kg (153 lb 11.2 oz)] 69.718 kg (153 lb 11.2 oz) (01/31 1154)  Intake/Output from previous day: 01/31 0701 - 02/01 0700 In: 2902.1 [I.V.:2802.1; IV Piggyback:100] Out: 2895 [Urine:500; Drains:350; LYYTK:3546]  - Confirmed that "stool" output was actually from ileal conduit so total UOP was 2545 cc.  Intake/Output this shift:    Physical Exam:  General: Alert and oriented CV: RRR Lungs: Clear Abdomen: Soft, ND, No BS Incisions: C/D/I Urostomy: Pink and patent Ext: NT, No erythema  Lab Results:  Recent Labs  08/11/13 1619 08/12/13 0658 08/13/13 0403  HGB 10.9* 9.3* 8.9*  HCT 32.4* 28.7* 27.6*   BMET  Recent Labs  08/12/13 0658 08/13/13 0403  NA 140 141  K 4.1 3.9  CL 103 105  CO2 27 26  GLUCOSE 184* 143*  BUN 19 16  CREATININE 2.04* 1.96*  CALCIUM 8.3* 8.0*     Studies/Results: No results found.  Assessment/Plan: POD#2 s/p robotic cystoprostatectomy and ileal conduit - Ambulate, IS - Will give Dulcolax suppository - Cr stable, Hgb stable, will continue to monitor but do not suspect obstructive etiology, continue IVF - Pt overall doing very well   LOS: 3 days   Solmon Bohr,LES 08/13/2013, 8:44 AM

## 2013-08-13 NOTE — Progress Notes (Signed)
INITIAL NUTRITION ASSESSMENT  DOCUMENTATION CODES Per approved criteria  -Severe malnutrition in the context of chronic illness   INTERVENTION: 1. Advance diet as tolerate per MD.  2. RD will monitor intake and add supplements as needed.   NUTRITION DIAGNOSIS: Inadequate oral intake related to cystectomy as evidenced by NPO status.   Goal: Patient will tolerate diet advancement to meet >/=90% of estimated nutrition needs  Monitor:  Patient will meet >/=90% of estimated nutrition needs  Reason for Assessment: Malnutrition screening tool  63 y.o. male  Admitting Dx: Bladder Cancer  ASSESSMENT: Patient with history of bladder cancer, he is s/p neoadjuvant chemotherapy, last cycle in December. He presents to hospital for cystectomy, prostatectomy, and lymphadenectomy. He is currently NPO.   Patient reports good intake for the last month prior to admission. However, he has lost about 20 pounds (9% of his UBW) since October 2014 due to decreased appetite with chemo.   Patient meets the criteria for severe MALNUTRITION in the context of chronic illness with 9% weight loss in 3 months and PO intake <75% of estimated needs.   Height: Ht Readings from Last 1 Encounters:  08/12/13 5\' 9"  (1.753 m)    Weight: Wt Readings from Last 1 Encounters:  08/12/13 153 lb 11.2 oz (69.718 kg)    Ideal Body Weight: 168 pounds  % Ideal Body Weight: 91%  Wt Readings from Last 10 Encounters:  08/12/13 153 lb 11.2 oz (69.718 kg)  08/12/13 153 lb 11.2 oz (69.718 kg)  07/14/13 160 lb 9.6 oz (72.848 kg)  06/16/13 156 lb 4.8 oz (70.897 kg)  06/09/13 161 lb 3.2 oz (73.12 kg)  05/19/13 166 lb 6.4 oz (75.479 kg)  05/05/13 164 lb 6.4 oz (74.571 kg)  04/25/13 168 lb (76.204 kg)  04/24/13 168 lb (76.204 kg)  04/07/13 166 lb 14.4 oz (75.705 kg)    Usual Body Weight: 168 pounds  % Usual Body Weight: 96%  BMI:  Body mass index is 22.69 kg/(m^2). Patient is normal weight.  Estimated Nutritional  Needs: Kcal: 1900-2050 kcal Protein: 85-100 g Fluid: >2.1 L/day  Skin: Incision, abdomen, closed system drain  Diet Order: NPO  EDUCATION NEEDS: -No education needs identified at this time   Intake/Output Summary (Last 24 hours) at 08/13/13 1138 Last data filed at 08/13/13 1056  Gross per 24 hour  Intake 2652.08 ml  Output   3110 ml  Net -457.92 ml    Last BM: PTA   Labs:   Recent Labs Lab 08/11/13 1619 08/12/13 0658 08/13/13 0403  NA 139 140 141  K 3.5* 4.1 3.9  CL 101 103 105  CO2 23 27 26   BUN 16 19 16   CREATININE 1.59* 2.04* 1.96*  CALCIUM 8.8 8.3* 8.0*  GLUCOSE 183* 184* 143*    CBG (last 3)  No results found for this basename: GLUCAP,  in the last 72 hours  Scheduled Meds: . alvimopan  12 mg Oral BID  . amLODipine  10 mg Oral q morning - 10a  . carvedilol  12.5 mg Oral BID WC  . Chlorhexidine Gluconate Cloth  6 each Topical Q0600  . docusate sodium  100 mg Oral BID  . heparin subcutaneous  5,000 Units Subcutaneous Q8H  . hydrochlorothiazide  25 mg Oral q morning - 10a  . HYDROmorphone PCA 0.3 mg/mL   Intravenous Q4H  . piperacillin-tazobactam  3.375 g Intravenous Q8H  . senna  1 tablet Oral BID  . simvastatin  10 mg Oral q1800  .  sodium chloride  10-40 mL Intracatheter Q12H    Continuous Infusions: . bupivacaine ON-Q pain pump    . dextrose 5 % and 0.45 % NaCl with KCl 20 mEq/L 125 mL/hr at 08/13/13 4163    Past Medical History  Diagnosis Date  . Hypertension   . Bladder mass   . Prostate enlargement   . Hyperlipidemia   . Headache(784.0)     hx. migraines  . Cancer     bladder cancer-hx. of past tumors  . Bladder cancer     25 yr history  . LV dysfunction     resolved by 2012 stress test. EF 52%  . Erectile dysfunction   . Nonspecific abnormal unspecified cardiovascular function study     EF 52% in 10/12. Basal inferior wall defect. 7:45 on treadmill.  . Cardiomyopathy     Result    Past Surgical History  Procedure  Laterality Date  . Bladder tumor excision      Multiple times , none in 10 yrs  . Transurethral resection of bladder tumor N/A 03/16/2013    Procedure: TRANSURETHRAL RESECTION OF BLADDER TUMOR (TURBT);  Surgeon: Ailene Rud, MD;  Location: [facility];  Service: Urology;  Laterality: N/A;    Larey Seat, RD, LDN Pager #: [phone] After-Hours Pager #: [phone]

## 2013-08-14 ENCOUNTER — Encounter (HOSPITAL_COMMUNITY): Payer: Self-pay | Admitting: Urology

## 2013-08-14 LAB — BASIC METABOLIC PANEL
BUN: 14 mg/dL (ref 6–23)
CHLORIDE: 104 meq/L (ref 96–112)
CO2: 27 mEq/L (ref 19–32)
Calcium: 8.2 mg/dL — ABNORMAL LOW (ref 8.4–10.5)
Creatinine, Ser: 1.72 mg/dL — ABNORMAL HIGH (ref 0.50–1.35)
GFR calc non Af Amer: 40 mL/min — ABNORMAL LOW (ref >90)
GFR, EST AFRICAN AMERICAN: 47 mL/min — AB (ref >90)
Glucose, Bld: 115 mg/dL — ABNORMAL HIGH (ref 70–99)
POTASSIUM: 3.7 meq/L (ref 3.7–5.3)
SODIUM: 142 meq/L (ref 137–147)

## 2013-08-14 LAB — CBC
HEMATOCRIT: 26.4 % — AB (ref 39.0–52.0)
HEMOGLOBIN: 8.6 g/dL — AB (ref 13.0–17.0)
MCH: 28 pg (ref 26.0–34.0)
MCHC: 32.6 g/dL (ref 30.0–36.0)
MCV: 86 fL (ref 78.0–100.0)
Platelets: 142 10*3/uL — ABNORMAL LOW (ref 150–400)
RBC: 3.07 MIL/uL — AB (ref 4.22–5.81)
RDW: 13.6 % (ref 11.5–15.5)
WBC: 6.6 10*3/uL (ref 4.0–10.5)

## 2013-08-14 NOTE — Anesthesia Postprocedure Evaluation (Signed)
  Anesthesia Post-op Note  Patient: Kenneth Lawrence  Procedure(s) Performed: Procedure(s) (LRB): ROBOTIC ASSISTED LAPAROSCOPIC COMPLETE CYSTECTOMY, PROSTATECTOMY,  ILEAL CONDUIT , ALSO CYSTOSCOPY, INDOCYANINE GREEN DYE INJECTION OF TUMOR (N/A) LYMPHADENECTOMY (Bilateral)  Patient Location: PACU  Anesthesia Type: General  Level of Consciousness: awake and alert   Airway and Oxygen Therapy: Patient Spontanous Breathing  Post-op Pain: mild  Post-op Assessment: Post-op Vital signs reviewed, Patient's Cardiovascular Status Stable, Respiratory Function Stable, Patent Airway and No signs of Nausea or vomiting  Last Vitals:  Filed Vitals:   08/14/13 0445  BP: 119/62  Pulse: 64  Temp: 36.4 C  Resp: 11    Post-op Vital Signs: stable   Complications: No apparent anesthesia complications

## 2013-08-14 NOTE — Progress Notes (Signed)
3 Days Post-Op  Subjective:  1 - High Grade Locally Advanced Bladder Cancer - s/p robotic cystectomy with ICG sentinel + template pelvic lymphadenectomy and ileal conduit urinary diversion + fascial pain pump placement 08/11/2013. Had neo-adjuvant chemo. Intraoperative frozens confirm at least T3 diasease, final path pending.   2 - Post-op Ileus / Malnutrition - kept NPO initially post-op given bowel anastamosis. Pt reports some flatus POD 3 and therefore advanced to clear diet. Albumin low-normal on admit.  3 - Renal Insufficiency - baseline Cr 1.2-2 range pre-op with rt malignant obstruction. Cr with slight bump post-op, now trending down. Rt neph tube capped post-op.  4 - Disposition / Rehab - PT eval pending. Wound ostomy RN working with pt.  No baseline mobility issues. On heparin DVT proph peri-op.  Today Nicole Kindred is doing well. Had some flatus last PM. Pain controlled. Ambulating in halls.   Objective: Vital signs in last 24 hours: Temp:  [97.5 F (36.4 C)-98.1 F (36.7 C)] 97.5 F (36.4 C) (02/02 0445) Pulse Rate:  [62-88] 64 (02/02 0445) Resp:  [9-14] 11 (02/02 0445) BP: (101-145)/(54-67) 119/62 mmHg (02/02 0445) SpO2:  [100 %] 100 % (02/02 0445) Last BM Date: 08/13/13  Intake/Output from previous day: 02/01 0701 - 02/02 0700 In: 2347.9 [I.V.:2197.9; IV Piggyback:150] Out: 2945 [Urine:2550; Drains:395] Intake/Output this shift:    General appearance: alert, cooperative and appears stated age Head: Normocephalic, without obvious abnormality, atraumatic Eyes: conjunctivae/corneas clear. PERRL, EOM's intact. Fundi benign. Ears: normal TM's and external ear canals both ears Nose: Nares normal. Septum midline. Mucosa normal. No drainage or sinus tenderness. Throat: lips, mucosa, and tongue normal; teeth and gums normal Neck: no adenopathy, no carotid bruit, no JVD, supple, symmetrical, trachea midline and thyroid not enlarged, symmetric, no tenderness/mass/nodules Back:  symmetric, no curvature. ROM normal. No CVA tenderness. Resp: clear to auscultation bilaterally Chest wall: no tenderness, [place] port site c/d/i. No erythema. Cardio: regular rate and rhythm, S1, S2 normal, no murmur, click, rub or gallop GI: soft, non-tender; bowel sounds normal; no masses,  no organomegaly Male genitalia: normal Extremities: extremities normal, atraumatic, no cyanosis or edema Pulses: 2+ and symmetric Skin: Skin color, texture, turgor normal. No rashes or lesions Lymph nodes: Cervical, supraclavicular, and axillary nodes normal. Neurologic: Grossly normal Incision/Wound: incision sites c/d/i. On-Q pain catheter in place. JP with serous output. Rt neph tueb dry and capped. RLQ Urostomy pink / patent with bander stents in place and copious urine output.  Lab Results:   Recent Labs  08/13/13 0403 08/14/13 0451  WBC 9.0 6.6  HGB 8.9* 8.6*  HCT 27.6* 26.4*  PLT 137* 142*   BMET  Recent Labs  08/13/13 0403 08/14/13 0451  NA 141 142  K 3.9 3.7  CL 105 104  CO2 26 27  GLUCOSE 143* 115*  BUN 16 14  CREATININE 1.96* 1.72*  CALCIUM 8.0* 8.2*   PT/INR No results found for this basename: LABPROT, INR,  in the last 72 hours ABG No results found for this basename: PHART, PCO2, PO2, HCO3,  in the last 72 hours  Studies/Results: No results found.  Anti-infectives: Anti-infectives   Start     Dose/Rate Route Frequency Ordered Stop   08/11/13 1800  piperacillin-tazobactam (ZOSYN) IVPB 3.375 g     3.375 g 12.5 mL/hr over 240 Minutes Intravenous Every 8 hours 08/11/13 1730 08/14/13 1759   08/11/13 0600  [MAR Hold]  piperacillin-tazobactam (ZOSYN) IVPB 3.375 g     (On MAR Hold since 08/11/13 0717)  3.375 g 100 mL/hr over 30 Minutes Intravenous 30 min pre-op 08/10/13 1205 08/11/13 0850   08/10/13 1030  piperacillin-tazobactam (ZOSYN) IVPB 3.375 g  Status:  Discontinued     3.375 g 100 mL/hr over 30 Minutes Intravenous  Once 08/10/13 1026 08/10/13 1204       Assessment/Plan:  1 - High Grade Locally Advanced Bladder Cancer - path pending. Discussed likely locally advanced with pt.  Stop ABX today.  2 - Post-op Ileus / Malnutrition - begin clears, slow IVF.  3 - Renal Insufficiency - Cr trendign down. Possibly some reabsorption v. Peri-op pre-renal.   4 - Disposition / Rehab - PT eval. Appreciate ostomy RN. Reinforced goals for discharge and typical approx 7 day hospital course.  [facility], Sabrina Keough 08/14/2013

## 2013-08-14 NOTE — Care Management Note (Addendum)
    Page 1 of 2   08/17/2013     10:45:18 AM   CARE MANAGEMENT NOTE 08/17/2013  Patient:  Kenneth Lawrence, Kenneth Lawrence   Account Number:  [id number]  Date Initiated:  08/14/2013  Documentation initiated by:  [facility]  Subjective/Objective Assessment:   63 Y/O M ADMITTED W/BLADDER CA.     Action/Plan:   FROM HOME.   Anticipated DC Date:  08/17/2013   Anticipated DC Plan:  [place]  CM consult      Choice offered to / List presented to:  C-1 Patient        [place] arranged  HH-1 RN      [place].   Status of service:  Completed, signed off Medicare Important Message given?   (If response is "NO", the following Medicare IM given date fields will be blank) Date Medicare IM given:   Date Additional Medicare IM given:    Discharge Disposition:  [place]  Per UR Regulation:  Reviewed for med. necessity/level of care/duration of stay  If discussed at [place] of Stay Meetings, dates discussed:   08/15/2013  08/17/2013    Comments:  08/17/13 Taunia Frasco RN,BSN NCM [phone] D/C HOME W/HHRN.[facility] REP [place] AWARE OF ORDER, & D/C TODAY.  08/16/13 Collyn Selk RN,BSN NCM [phone] [facility] FOLLOWING FOR HHRN.[facility]EN [facility] REP AWARE OF HHRN ORDER.FOR ?D/C TOMORROW.  08/15/13 Lindia Garms RN,BSN NCM [phone] [facility] CHOSEN FOR HHRN IF ORDERED.AWAIT FINAL HHRN ORDER.[facility] REP [place]STEN FOLLOWING.  08/14/13 Rickardo Brinegar RN,BSN NCM [phone] POD#3 ROB RAD CYSTECTOMY.DILAUDID IV PCA,JP DRAIN-SEROUS-125CC,PCP,ADVANCING DIET CLEARS.WOC FOLLOWING.NO ANTICIPATED D/C NEEDS.

## 2013-08-14 NOTE — Evaluation (Signed)
Physical Therapy Evaluation Patient Details Name: Kenneth Lawrence MRN: [id number] DOB: 19-Oct-1950 Today's Date: 08/14/2013 Time: 3244-0102 PT Time Calculation (min): 17 min  PT Assessment / Plan / Recommendation History of Present Illness  63 y.o. male with bladder cancer admitted for laparoscopic cystoprostatectomy with bilateral pelvic lymphadenectomy and ileal conduit urinary diversion.  Clinical Impression  **Pt is independent with mobility, he walked 400'. No further PT indicated, PT signing off. Encouraged pt to continue walking in halls. *    PT Assessment  Patent does not need any further PT services    Follow Up Recommendations  No PT follow up    Does the patient have the potential to tolerate intense rehabilitation      Barriers to Discharge        Equipment Recommendations  None recommended by PT    Recommendations for Other Services     Frequency      Precautions / Restrictions Precautions Precaution Comments: JP drain Restrictions Weight Bearing Restrictions: No   Pertinent Vitals/Pain *2/10 incision site Pt using PCA**      Mobility  Bed Mobility Overal bed mobility: Modified Independent General bed mobility comments: HOB elevated, used rail Transfers Overall transfer level: Modified independent General transfer comment: with UE assist, increased time 2* pain Ambulation/Gait Ambulation/Gait assistance: Independent Ambulation Distance (Feet): 400 Feet Assistive device: None Gait Pattern/deviations: WFL(Within Functional Limits) Gait velocity: WFL    Exercises     PT Diagnosis:    PT Problem List:   PT Treatment Interventions:       PT Goals(Current goals can be found in the care plan section) Acute Rehab PT Goals Patient Stated Goal: to walk, take care of the yard PT Goal Formulation: No goals set, d/c therapy  Visit Information  Last PT Received On: 08/14/13 History of Present Illness: 63 y.o. male with bladder cancer admitted for  laparoscopic cystoprostatectomy with bilateral pelvic lymphadenectomy and ileal conduit urinary diversion.       Prior [place] expects to be discharged to:: Private residence Living Arrangements: Spouse/significant other Available Help at Discharge: Family;Available 24 hours/day Home Access: Stairs to enter Entrance Stairs-Number of Steps: 2 Home Layout: Two level;Able to live on main level with bedroom/bathroom Home Equipment: None Prior Function Level of Independence: Independent Communication Communication: No difficulties    Cognition  Cognition Arousal/Alertness: Awake/alert Behavior During Therapy: WFL for tasks assessed/performed Overall Cognitive Status: Within Functional Limits for tasks assessed    Extremity/Trunk Assessment Upper Extremity Assessment Upper Extremity Assessment: Overall WFL for tasks assessed Lower Extremity Assessment Lower Extremity Assessment: Overall WFL for tasks assessed Cervical / Trunk Assessment Cervical / Trunk Assessment: Normal   Balance Balance Overall balance assessment: No apparent balance deficits (not formally assessed);Independent  End of Session PT - End of Session Activity Tolerance: Patient tolerated treatment well Patient left: in chair;with call bell/phone within reach;with family/visitor present Nurse Communication: Mobility status  GP     Blondell Reveal Kistler 08/14/2013, 1:55 PM [phone]

## 2013-08-15 LAB — CBC
HEMATOCRIT: 26.5 % — AB (ref 39.0–52.0)
Hemoglobin: 8.8 g/dL — ABNORMAL LOW (ref 13.0–17.0)
MCH: 28.2 pg (ref 26.0–34.0)
MCHC: 33.2 g/dL (ref 30.0–36.0)
MCV: 84.9 fL (ref 78.0–100.0)
Platelets: 155 10*3/uL (ref 150–400)
RBC: 3.12 MIL/uL — ABNORMAL LOW (ref 4.22–5.81)
RDW: 13.2 % (ref 11.5–15.5)
WBC: 4.8 10*3/uL (ref 4.0–10.5)

## 2013-08-15 LAB — BASIC METABOLIC PANEL
BUN: 10 mg/dL (ref 6–23)
CHLORIDE: 104 meq/L (ref 96–112)
CO2: 26 meq/L (ref 19–32)
Calcium: 8.1 mg/dL — ABNORMAL LOW (ref 8.4–10.5)
Creatinine, Ser: 1.42 mg/dL — ABNORMAL HIGH (ref 0.50–1.35)
GFR calc Af Amer: 59 mL/min — ABNORMAL LOW (ref >90)
GFR calc non Af Amer: 51 mL/min — ABNORMAL LOW (ref >90)
GLUCOSE: 121 mg/dL — AB (ref 70–99)
POTASSIUM: 3.6 meq/L — AB (ref 3.7–5.3)
Sodium: 142 mEq/L (ref 137–147)

## 2013-08-15 LAB — CREATININE, FLUID (PLEURAL, PERITONEAL, JP DRAINAGE): Creat, Fluid: 1.4 mg/dL

## 2013-08-15 MED ORDER — HYDROMORPHONE HCL PF 1 MG/ML IJ SOLN: 1.0000 mg | INTRAMUSCULAR | Status: DC | PRN

## 2013-08-15 MED ORDER — OXYCODONE-ACETAMINOPHEN 5-325 MG PO TABS: 1.0000 | ORAL_TABLET | ORAL | Status: DC | PRN

## 2013-08-15 NOTE — Progress Notes (Signed)
4 Days Post-Op  Subjective:  1 - High Grade Locally Advanced Bladder Cancer - s/p robotic cystectomy with ICG sentinel + template pelvic lymphadenectomy and ileal conduit urinary diversion + fascial pain pump placement 08/11/2013. Had neo-adjuvant chemo. Final Path [id number] high-grade urothelial carcinoma, final margins negative. Pain catheter removed POD 4.  2 - Post-op Ileus / Malnutrition - kept NPO initially post-op given bowel anastamosis. Pt reports some flatus POD 3 and therefore advanced to clear diet, BM POD 4 and advanced to regular diet.  3 - Renal Insufficiency - baseline Cr 1.2-2 range pre-op with rt malignant obstruction. Cr with slight bump post-op, now trending down. Rt neph tube capped post-op.  4 - Disposition / Rehab - PT shows no home PT needs. Wound ostomy RN working with pt.  No baseline mobility issues. On heparin DVT proph peri-op.  Today Kenneth Lawrence is doing well. Had small BM yesterday. Pain controlled. We discussed his path results last night.   Objective: Vital signs in last 24 hours: Temp:  [97.6 F (36.4 C)-98.1 F (36.7 C)] 97.6 F (36.4 C) (02/03 0506) Pulse Rate:  [61-71] 61 (02/03 0506) Resp:  [11-16] 12 (02/03 0506) BP: (106-126)/(51-70) 126/51 mmHg (02/03 0506) SpO2:  [100 %] 100 % (02/03 0506) Last BM Date: 08/13/13  Intake/Output from previous day: 02/02 0701 - 02/03 0700 In: 2774.6 [P.O.:300; I.V.:2474.6] Out: 2920 [Urine:2500; Drains:420] Intake/Output this shift:    General appearance: alert, cooperative and appears stated age Head: Normocephalic, without obvious abnormality, atraumatic Eyes: conjunctivae/corneas clear. PERRL, EOM's intact. Fundi benign. Ears: normal TM's and external ear canals both ears Nose: Nares normal. Septum midline. Mucosa normal. No drainage or sinus tenderness. Throat: lips, mucosa, and tongue normal; teeth and gums normal Neck: no adenopathy, no carotid bruit, no JVD, supple, symmetrical, trachea midline and thyroid  not enlarged, symmetric, no tenderness/mass/nodules Back: symmetric, no curvature. ROM normal. No CVA tenderness. Resp: clear to auscultation bilaterally Chest wall: no tenderness Cardio: regular rate and rhythm, S1, S2 normal, no murmur, click, rub or gallop GI: soft, non-tender; bowel sounds normal; no masses,  no organomegaly Male genitalia: normal Extremities: extremities normal, atraumatic, no cyanosis or edema Pulses: 2+ and symmetric Skin: Skin color, texture, turgor normal. No rashes or lesions Lymph nodes: Cervical, supraclavicular, and axillary nodes normal. Neurologic: Grossly normal Incision/Wound: incision sites c/d/i. RLQ Urostomy pink / patent with clear urine and bander stents in place. JP with copious serous fluid. Pain catheter removed.  Lab Results:   Recent Labs  08/14/13 0451 08/15/13 0445  WBC 6.6 4.8  HGB 8.6* 8.8*  HCT 26.4* 26.5*  PLT 142* 155   BMET  Recent Labs  08/14/13 0451 08/15/13 0445  NA 142 142  K 3.7 3.6*  CL 104 104  CO2 27 26  GLUCOSE 115* 121*  BUN 14 10  CREATININE 1.72* 1.42*  CALCIUM 8.2* 8.1*   PT/INR No results found for this basename: LABPROT, INR,  in the last 72 hours ABG No results found for this basename: PHART, PCO2, PO2, HCO3,  in the last 72 hours  Studies/Results: No results found.  Anti-infectives: Anti-infectives   Start     Dose/Rate Route Frequency Ordered Stop   08/11/13 1800  piperacillin-tazobactam (ZOSYN) IVPB 3.375 g  Status:  Discontinued     3.375 g 12.5 mL/hr over 240 Minutes Intravenous Every 8 hours 08/11/13 1730 08/14/13 0755   08/11/13 0600  [MAR Hold]  piperacillin-tazobactam (ZOSYN) IVPB 3.375 g     (On MAR Hold since 08/11/13  0717)   3.375 g 100 mL/hr over 30 Minutes Intravenous 30 min pre-op 08/10/13 1205 08/11/13 0850   08/10/13 1030  piperacillin-tazobactam (ZOSYN) IVPB 3.375 g  Status:  Discontinued     3.375 g 100 mL/hr over 30 Minutes Intravenous  Once 08/10/13 1026 08/10/13 1204       Assessment/Plan:  1 - High Grade Locally Advanced Bladder Cancer - Path discussed.   2 - Post-op Ileus / Malnutrition - regular diet, decrease IVF.   3 - Renal Insufficiency - Cr trendign down. Possibly some reabsorption v. Peri-op pre-renal. JP Cr today.  4 - Disposition / Rehab - . Appreciate ostomy RN. Will need HH RN for ostomy reinforcement teaching and supplies. Reinforced goals for discharge and typical approx 7 day hospital course  [facility], Leighanne Adolph 08/15/2013

## 2013-08-16 ENCOUNTER — Inpatient Hospital Stay (HOSPITAL_COMMUNITY): Payer: Commercial Managed Care - PPO

## 2013-08-16 NOTE — Consult Note (Signed)
WOC ostomy consult note: Late entry for visit made on 08/15/13) Stoma type/location: RUQ ileal conduit Stomal assessment/size: 1 and 1/4 inch round, budded, edematous stoma with two stents intact (red in right ureter, blue in left).  Os at center Peristomal assessment: intact, clear Treatment options for stomal/peristomal skin: none indicated Output clear amber urine Ostomy pouching: 1pc.convex urinary pouching system  Education provided: Patient's wife and patient present for elongated visit to cover basic A&P, stoma characteristics, pouch characteristics, function of stent placement and duration.  Dr. Tresa Moore in to visit patient during our visit and is pleased with patient progress.  We anticipate discharge later this week with [facility] support.  Patient and wife shown how to attach and disconnect bedside urinary drainage bag, also how to empty pouch and when and indicate understanding.  Hydration goals noted and appreciated by patient and family. At their request, I have established them with [facility], a post-acute discharge sampling and support program. I will visit tomorrow to evaluation retention of teaching and to answer any questions they may have had overnight. [place] nursing team will continue to follow, and will remain available to this patient, the nursing and surgical teams.  Please re-consult if needed in-between visits. Thanks, Maudie Flakes, MSN, RN, [place], [place], [place] [phone])

## 2013-08-16 NOTE — Progress Notes (Signed)
5 Days Post-Op  Subjective:  1 - High Grade Locally Advanced Bladder Cancer - s/p robotic cystectomy with ICG sentinel + template pelvic lymphadenectomy and ileal conduit urinary diversion + fascial pain pump placement 08/11/2013. Had neo-adjuvant chemo. Final Path [id number] high-grade urothelial carcinoma, final margins negative. Pain catheter removed POD 4. JP removed POD 5 as Cr same as serum.  2 - Post-op Ileus / Malnutrition - kept NPO initially post-op given bowel anastamosis. Pt reports some flatus POD 3 and therefore advanced to clear diet, BM POD 4 and advanced to regular diet.  3 - Renal Insufficiency - baseline Cr 1.2-2 range pre-op with rt malignant obstruction. Cr with slight bump post-op, now trending down. Rt neph tube capped post-op.  4 - Disposition / Rehab - PT shows no home PT needs. Wound ostomy RN working with pt.  No baseline mobility issues. On heparin DVT proph peri-op.  Today Nicole Kindred is continuing to progress. Several BM's and tolerating regular diet.  Objective: Vital signs in last 24 hours: Temp:  [98.3 F (36.8 C)-98.5 F (36.9 C)] 98.4 F (36.9 C) (02/04 0445) Pulse Rate:  [65-75] 68 (02/04 0445) Resp:  [11-20] 16 (02/04 0809) BP: (111-133)/(55-62) 117/59 mmHg (02/04 0445) SpO2:  [99 %-100 %] 100 % (02/04 0809) Last BM Date: 08/15/13  Intake/Output from previous day: 02/03 0701 - 02/04 0700 In: 1141.7 [P.O.:480; I.V.:661.7] Out: 2032 [Urine:1625; Drains:407] Intake/Output this shift: Total I/O In: 320 [P.O.:320] Out: 65 [Drains:65]  General appearance: alert, cooperative and appears stated age Head: Normocephalic, without obvious abnormality, atraumatic Eyes: conjunctivae/corneas clear. PERRL, EOM's intact. Fundi benign. Ears: normal TM's and external ear canals both ears Nose: Nares normal. Septum midline. Mucosa normal. No drainage or sinus tenderness. Throat: lips, mucosa, and tongue normal; teeth and gums normal Neck: no adenopathy, no carotid  bruit, no JVD, supple, symmetrical, trachea midline and thyroid not enlarged, symmetric, no tenderness/mass/nodules Back: symmetric, no curvature. ROM normal. No CVA tenderness. Resp: clear to auscultation bilaterally Chest wall: no tenderness Cardio: regular rate and rhythm, S1, S2 normal, no murmur, click, rub or gallop GI: soft, non-tender; bowel sounds normal; no masses,  no organomegaly Male genitalia: normal Extremities: extremities normal, atraumatic, no cyanosis or edema Pulses: 2+ and symmetric Skin: Skin color, texture, turgor normal. No rashes or lesions Lymph nodes: Cervical, supraclavicular, and axillary nodes normal. Neurologic: Grossly normal Incision/Wound: recent incision sites c/d/i. RLQ Urostomy pink / patent with Bander stents in situ. Clear urien. JP removed and dry dressing applied.   Lab Results:   Recent Labs  08/14/13 0451 08/15/13 0445  WBC 6.6 4.8  HGB 8.6* 8.8*  HCT 26.4* 26.5*  PLT 142* 155   BMET  Recent Labs  08/14/13 0451 08/15/13 0445  NA 142 142  K 3.7 3.6*  CL 104 104  CO2 27 26  GLUCOSE 115* 121*  BUN 14 10  CREATININE 1.72* 1.42*  CALCIUM 8.2* 8.1*   PT/INR No results found for this basename: LABPROT, INR,  in the last 72 hours ABG No results found for this basename: PHART, PCO2, PO2, HCO3,  in the last 72 hours  Studies/Results: No results found.  Anti-infectives: Anti-infectives   Start     Dose/Rate Route Frequency Ordered Stop   08/11/13 1800  piperacillin-tazobactam (ZOSYN) IVPB 3.375 g  Status:  Discontinued     3.375 g 12.5 mL/hr over 240 Minutes Intravenous Every 8 hours 08/11/13 1730 08/14/13 0755   08/11/13 0600  [MAR Hold]  piperacillin-tazobactam (ZOSYN) IVPB 3.375 g     (  On MAR Hold since 08/11/13 0717)   3.375 g 100 mL/hr over 30 Minutes Intravenous 30 min pre-op 08/10/13 1205 08/11/13 0850   08/10/13 1030  piperacillin-tazobactam (ZOSYN) IVPB 3.375 g  Status:  Discontinued     3.375 g 100 mL/hr over 30  Minutes Intravenous  Once 08/10/13 1026 08/10/13 1204      Assessment/Plan:  1 - High Grade Locally Advanced Bladder Cancer - Path discussed.   2 - Post-op Ileus / Malnutrition - regular diet, saline lock. DC PCA begin PO pain meds.  3 - Renal Insufficiency - Cr now back to baseline. JP Cr same as serum and removed today.  4 - Disposition / Rehab - . Appreciate ostomy RN. Will need HH RN for ostomy reinforcement teaching and supplies. If he has good day today on only PO meds, will likely DC tomorrow.  [facility], Jeryn Bertoni 08/16/2013

## 2013-08-17 MED ORDER — OXYCODONE-ACETAMINOPHEN 5-325 MG PO TABS: 1.0000 | ORAL_TABLET | Freq: Four times a day (QID) | ORAL | Status: AC | PRN

## 2013-08-17 MED ORDER — SENNOSIDES-DOCUSATE SODIUM 8.6-50 MG PO TABS: 1.0000 | ORAL_TABLET | Freq: Two times a day (BID) | ORAL | Status: AC

## 2013-08-17 MED ORDER — HEPARIN SOD (PORK) LOCK FLUSH 100 UNIT/ML IV SOLN
500.0000 [IU] | INTRAVENOUS | Status: AC | PRN
  Administered 2013-08-17: 500 [IU]

## 2013-08-17 NOTE — Discharge Summary (Signed)
Physician Discharge Summary  Patient ID: Kenneth Lawrence MRN: [id number] DOB/AGE: 08/04/1950 63 y.o.  Admit date: 08/10/2013 Discharge date: 08/17/2013  Admission Diagnoses: Bladder Cancer  Discharge Diagnoses: Stage 3 Bladder Cancer   Discharged Condition: good  Hospital Course:   1 - High Grade Locally Advanced Bladder Cancer - s/p robotic cystectomy with ICG sentinel + template pelvic lymphadenectomy and ileal conduit urinary diversion + fascial pain pump placement 08/11/2013 after admission on 1/29 for bowel prep. Had neo-adjuvant chemo. Final Path [id number] high-grade urothelial carcinoma, final margins negative. Pain catheter removed POD 4. JP removed POD 5 as Cr same as serum.   2 - Post-op Ileus / Malnutrition - kept NPO initially post-op given bowel anastamosis. Pt reports some flatus POD 3 and therefore advanced to clear diet, BM POD 4 and advanced to regular diet.   3 - Renal Insufficiency - baseline Cr 1.2-2 range pre-op with rt malignant obstruction. Cr with slight bump post-op, but back to <1.5 by discharge.  4 - Disposition / Rehab - PT shows no home PT needs. Wound ostomy RN working with pt. No baseline mobility issues.   In summary pt's hospital course was unremarkable following major surgery. His rt nephrostomy tube will be left in place, capped, as it appears to be overlapping rt nephro-ureteral stent.   Consults: Wound-Ostomy, PT  Significant Diagnostic Studies: labs: pathology as per above.  Treatments: surgery:  robotic cystectomy with ICG sentinel + template pelvic lymphadenectomy and ileal conduit urinary diversion + fascial pain pump placement 08/11/2013  Discharge Exam: Blood pressure 122/59, pulse 73, temperature 98.7 F (37.1 C), temperature source Oral, resp. rate 16, height 5\' 9"  (1.753 m), weight 69.718 kg (153 lb 11.2 oz), SpO2 100.00%. General appearance: alert, cooperative and appears stated age Head: Normocephalic, without obvious abnormality,  atraumatic Eyes: conjunctivae/corneas clear. PERRL, EOM's intact. Fundi benign. Ears: normal TM's and external ear canals both ears Nose: Nares normal. Septum midline. Mucosa normal. No drainage or sinus tenderness. Throat: lips, mucosa, and tongue normal; teeth and gums normal Neck: no adenopathy, no carotid bruit, no JVD, supple, symmetrical, trachea midline and thyroid not enlarged, symmetric, no tenderness/mass/nodules Back: symmetric, no curvature. ROM normal. No CVA tenderness., rt neph tube capped, c/d/i. Resp: clear to auscultation bilaterally Chest wall: no tenderness Cardio: regular rate and rhythm, S1, S2 normal, no murmur, click, rub or gallop GI: soft, non-tender; bowel sounds normal; no masses,  no organomegaly Male genitalia: normal Extremities: extremities normal, atraumatic, no cyanosis or edema Pulses: 2+ and symmetric Skin: Skin color, texture, turgor normal. No rashes or lesions Lymph nodes: Cervical, supraclavicular, and axillary nodes normal. Neurologic: Grossly normal Incision/Wound: Recent incision sites c/d/i. Urostomy pink / patent with red and blue bander stents in place. Prior JP site c/d/i. [address] port site c/d/i no erythema.  Disposition: 01-Home or Self Care   Future Appointments Provider Department Dept Phone   09/15/2013 9:30 AM [facility] [phone]   09/15/2013 10:00 AM Wyatt Portela, MD [facility] [phone]       Medication List    STOP taking these medications       aspirin EC 81 MG tablet     cholecalciferol 1000 UNITS tablet  Commonly known as:  VITAMIN D     fish oil-omega-3 fatty acids 1000 MG capsule     vitamin C 1000 MG tablet      TAKE these medications       amLODipine  10 MG tablet  Commonly known as:  NORVASC  Take 10 mg by mouth every morning.     carvedilol 12.5 MG tablet  Commonly known as:  COREG  Take 12.5 mg by mouth 2 (two) times daily  with a meal.     hydrochlorothiazide 25 MG tablet  Commonly known as:  HYDRODIURIL  Take 25 mg by mouth every morning.     lidocaine-prilocaine cream  Commonly known as:  EMLA  Apply 1 application topically daily as needed. port-cath     ondansetron 8 MG tablet  Commonly known as:  ZOFRAN  Take 8 mg by mouth every 8 (eight) hours as needed for nausea or vomiting.     oxyCODONE-acetaminophen 5-325 MG per tablet  Commonly known as:  PERCOCET/ROXICET  Take 1-2 tablets by mouth every 6 (six) hours as needed for moderate pain. Post-operatively.     potassium chloride SA 20 MEQ tablet  Commonly known as:  K-DUR,KLOR-CON  Take 20 mEq by mouth daily.     pravastatin 20 MG tablet  Commonly known as:  PRAVACHOL  Take 20 mg by mouth every morning.     PRESCRIPTION MEDICATION  Gemcitabine HCl (GEMZAR) 1,938 mg in sodium chloride 0.9 % 100 mL chemo infusion 1,000 mg/m2  1.92 m2 (Treatment Plan Actual)  Once 06/16/2013     senna-docusate 8.6-50 MG per tablet  Commonly known as:  Senokot-S  Take 1 tablet by mouth 2 (two) times daily. While taking pain meds to prevent constipation           Follow-up Information   Follow up with Alexis Frock, MD. (We will call you with office visit in about 2 weeks.)    Specialty:  Urology   Contact information:   [facility]  [place] [address] [phone]       Signed: Alexis Frock 08/17/2013, 7:45 AM

## 2013-09-15 ENCOUNTER — Other Ambulatory Visit (HOSPITAL_BASED_OUTPATIENT_CLINIC_OR_DEPARTMENT_OTHER): Payer: Commercial Managed Care - PPO

## 2013-09-15 ENCOUNTER — Encounter: Payer: Self-pay | Admitting: Oncology

## 2013-09-15 ENCOUNTER — Telehealth: Payer: Self-pay | Admitting: Oncology

## 2013-09-15 ENCOUNTER — Ambulatory Visit (HOSPITAL_BASED_OUTPATIENT_CLINIC_OR_DEPARTMENT_OTHER): Payer: Commercial Managed Care - PPO | Admitting: Oncology

## 2013-09-15 VITALS — BP 121/60 | HR 63 | Temp 97.8°F | Resp 18 | Ht 69.0 in | Wt 151.4 lb

## 2013-09-15 DIAGNOSIS — C679 Malignant neoplasm of bladder, unspecified: Secondary | ICD-10-CM

## 2013-09-15 LAB — CBC WITH DIFFERENTIAL/PLATELET
BASO%: 0.5 % (ref 0.0–2.0)
Basophils Absolute: 0 10*3/uL (ref 0.0–0.1)
EOS%: 10.1 % — AB (ref 0.0–7.0)
Eosinophils Absolute: 0.6 10*3/uL — ABNORMAL HIGH (ref 0.0–0.5)
HCT: 33.2 % — ABNORMAL LOW (ref 38.4–49.9)
HGB: 10.8 g/dL — ABNORMAL LOW (ref 13.0–17.1)
LYMPH%: 30.1 % (ref 14.0–49.0)
MCH: 26.5 pg — ABNORMAL LOW (ref 27.2–33.4)
MCHC: 32.4 g/dL (ref 32.0–36.0)
MCV: 81.8 fL (ref 79.3–98.0)
MONO#: 0.7 10*3/uL (ref 0.1–0.9)
MONO%: 11.2 % (ref 0.0–14.0)
NEUT#: 3 10*3/uL (ref 1.5–6.5)
NEUT%: 48.1 % (ref 39.0–75.0)
PLATELETS: 229 10*3/uL (ref 140–400)
RBC: 4.06 10*6/uL — AB (ref 4.20–5.82)
RDW: 14 % (ref 11.0–14.6)
WBC: 6.2 10*3/uL (ref 4.0–10.3)
lymph#: 1.9 10*3/uL (ref 0.9–3.3)

## 2013-09-15 LAB — COMPREHENSIVE METABOLIC PANEL (CC13)
ALBUMIN: 3.2 g/dL — AB (ref 3.5–5.0)
ALT: 7 U/L (ref 0–55)
AST: 12 U/L (ref 5–34)
Alkaline Phosphatase: 83 U/L (ref 40–150)
Anion Gap: 10 mEq/L (ref 3–11)
BILIRUBIN TOTAL: 0.46 mg/dL (ref 0.20–1.20)
BUN: 11.8 mg/dL (ref 7.0–26.0)
CO2: 31 mEq/L — ABNORMAL HIGH (ref 22–29)
Calcium: 9.7 mg/dL (ref 8.4–10.4)
Chloride: 105 mEq/L (ref 98–109)
Creatinine: 1.5 mg/dL — ABNORMAL HIGH (ref 0.7–1.3)
Glucose: 115 mg/dl (ref 70–140)
POTASSIUM: 3.4 meq/L — AB (ref 3.5–5.1)
SODIUM: 147 meq/L — AB (ref 136–145)
Total Protein: 7.2 g/dL (ref 6.4–8.3)

## 2013-09-15 MED ORDER — HEPARIN SOD (PORK) LOCK FLUSH 100 UNIT/ML IV SOLN
500.0000 [IU] | Freq: Once | INTRAVENOUS | Status: AC
  Administered 2013-09-15: 500 [IU] via INTRAVENOUS
  Filled 2013-09-15: qty 5

## 2013-09-15 MED ORDER — SODIUM CHLORIDE 0.9 % IJ SOLN
10.0000 mL | INTRAMUSCULAR | Status: DC | PRN
  Administered 2013-09-15: 10 mL via INTRAVENOUS
  Filled 2013-09-15: qty 10

## 2013-09-15 NOTE — Progress Notes (Signed)
Hematology and Oncology Follow Up Visit  RANI SISNEY [id number] May 16, 1951 63 y.o. 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 He has a 5.1 cm of the right posterior bladder wall and invasion into the perivesicularwith clinical staging T4 N0 disease.   Prior Therapy: On 03/16/2013 he underwent cystourethroscopy and the resection of a 10 cm right lateral bladder wall tumor. He is S/P neoadjuvant chemotherapy with Cisplatin and Gemzar  started on on 04/28/13 and was completed 06/16/2013. He is status post robotic-assisted laparoscopic complete cystectomy, prostatectomy, and ileal conduit formation with lymphadenectomy. This was done on 08/11/2013.  Current therapy: Observation and surveillance.  Interim History:  Mr. Castrillon presents today for a followup visit. He is a pleasant gentleman with the above history. Since his last visit, he completed his surgical resection without any complications. He was discharged from the hospital around 08/17/2013 without any issues. He is recovering quite nicely and almost regained most activities of daily living. He reports no major changes in his appetite or performance status. He is not reporting any urination problems at this time. Has not reported any hematuria or dysuria. Has not reported any peripheral neuropathy or hearing difficulties. He lost about 9 pounds but seems to be recovering slowly.  Medications: I have reviewed the patient's current medications.  Current Outpatient Prescriptions  Medication Sig Dispense Refill  . amLODipine (NORVASC) 10 MG tablet Take 10 mg by mouth every morning.      . carvedilol (COREG) 12.5 MG tablet Take 12.5 mg by mouth 2 (two) times daily with a meal.      . hydrochlorothiazide (HYDRODIURIL) 25 MG tablet Take 25 mg by mouth every morning.      . lidocaine-prilocaine (EMLA) cream Apply 1  application topically daily as needed. port-cath      . ondansetron (ZOFRAN) 8 MG tablet Take 8 mg by mouth every 8 (eight) hours as needed for nausea or vomiting.      Marland Kitchen oxyCODONE-acetaminophen (PERCOCET/ROXICET) 5-325 MG per tablet Take 1-2 tablets by mouth every 6 (six) hours as needed for moderate pain. Post-operatively.  40 tablet  0  . potassium chloride SA (K-DUR,KLOR-CON) 20 MEQ tablet Take 20 mEq by mouth daily.      . pravastatin (PRAVACHOL) 20 MG tablet Take 20 mg by mouth every morning.      Marland Kitchen PRESCRIPTION MEDICATION Gemcitabine HCl (GEMZAR) 1,938 mg in sodium chloride 0.9 % 100 mL chemo infusion 1,000 mg/m2  1.92 m2 (Treatment Plan Actual)  Once 06/16/2013      . senna-docusate (SENOKOT-S) 8.6-50 MG per tablet Take 1 tablet by mouth 2 (two) times daily. While taking pain meds to prevent constipation  20 tablet  1   Current Facility-Administered Medications  Medication Dose Route Frequency Provider Last Rate Last Dose  . heparin lock flush 100 unit/mL  500 Units Intravenous Once Wyatt Portela, MD      . sodium chloride 0.9 % injection 10 mL  10 mL Intravenous PRN Wyatt Portela, MD         Allergies: No Known Allergies  Past Medical History, Surgical history, Social history, and Family History were reviewed and updated.  Review of Systems:  Remaining ROS negative.  Physical Exam: Blood pressure 121/60, pulse 63, temperature 97.8 F (36.6 C), temperature source Oral, resp. rate 18, height 5\' 9"  (1.753 m), weight 151 lb 6.4 oz (68.675 kg), SpO2 100.00%. ECOG: 1 General  appearance: alert, cooperative and appears stated age Head: Normocephalic, without obvious abnormality, atraumatic Neck: no adenopathy, no carotid bruit, no JVD, supple, symmetrical, trachea midline and thyroid not enlarged, symmetric, no tenderness/mass/nodules Lymph nodes: Cervical, supraclavicular, and axillary nodes normal. Heart:regular rate and rhythm, S1, S2 normal, no murmur, click, rub or  gallop Lung:chest clear, no wheezing, rales, normal symmetric air entry Abdomen: soft, non-tender, without masses or organomegaly EXT:no erythema, induration, or nodules   Lab Results: Lab Results  Component Value Date   WBC 6.2 09/15/2013   HGB 10.8* 09/15/2013   HCT 33.2* 09/15/2013   MCV 81.8 09/15/2013   PLT 229 09/15/2013     Chemistry      Component Value Date/Time   NA 147* 09/15/2013 0924   NA 142 08/15/2013 0445   K 3.4* 09/15/2013 0924   K 3.6* 08/15/2013 0445   CL 104 08/15/2013 0445   CO2 31* 09/15/2013 0924   CO2 26 08/15/2013 0445   BUN 11.8 09/15/2013 0924   BUN 10 08/15/2013 0445   CREATININE 1.5* 09/15/2013 0924   CREATININE 1.42* 08/15/2013 0445      Component Value Date/Time   CALCIUM 9.7 09/15/2013 0924   CALCIUM 8.1* 08/15/2013 0445   ALKPHOS 83 09/15/2013 0924   ALKPHOS 67 08/10/2013 2040   AST 12 09/15/2013 0924   AST 13 08/10/2013 2040   ALT 7 09/15/2013 0924   ALT 9 08/10/2013 2040   BILITOT 0.46 09/15/2013 0924   BILITOT 0.8 08/10/2013 2040       Impression and Plan:   63 year old gentleman with the following issues:  1. 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 He received neoadjuvant chemotherapy with cisplatin and Gemzar and underwent a surgical resection on 08/11/2013. His final pathology revealed a T3 N0 disease without any evidence of lymph node involvement on his lymphadenectomy. The margins were negative at this time. I see no role for adjuvant chemotherapy at this time and given the fact that he had negative margins I do not think adjuvant radiation therapy has a major role. We'll continue on active surveillance and repeat imaging studies in about 2 months.  2. IV access: PAC is in place and flushed today and that can be removed after his next CT scan of everything is clear.  3. Acute renal failure: This has resolved. His creatinine close to baseline.  4. Follow-up: He will return in 11/2013 after his CT  scan.  GQQPYP,PJKDT 3/6/201510:20 AM

## 2013-09-15 NOTE — Telephone Encounter (Signed)
received call from central Estill Batten) re changing lab time on 5/5 to coord w/ct. lab moved to 11am - central will give pt new lab time w/ct time.

## 2013-09-15 NOTE — Telephone Encounter (Signed)
gv pt appt schedule for april. central will call w/ct appt - pt aware.

## 2013-11-14 ENCOUNTER — Ambulatory Visit (HOSPITAL_COMMUNITY)
Admission: RE | Admit: 2013-11-14 | Discharge: 2013-11-14 | Disposition: A | Discharge status: Home / Self Care | Payer: Commercial Managed Care - PPO | Source: Ambulatory Visit | Attending: Oncology | Admitting: Oncology

## 2013-11-14 ENCOUNTER — Encounter (HOSPITAL_COMMUNITY): Payer: Self-pay

## 2013-11-14 ENCOUNTER — Other Ambulatory Visit (HOSPITAL_BASED_OUTPATIENT_CLINIC_OR_DEPARTMENT_OTHER): Payer: Commercial Managed Care - PPO

## 2013-11-14 DIAGNOSIS — C679 Malignant neoplasm of bladder, unspecified: Secondary | ICD-10-CM

## 2013-11-14 DIAGNOSIS — Z9221 Personal history of antineoplastic chemotherapy: Secondary | ICD-10-CM | POA: Insufficient documentation

## 2013-11-14 DIAGNOSIS — R599 Enlarged lymph nodes, unspecified: Secondary | ICD-10-CM | POA: Insufficient documentation

## 2013-11-14 LAB — CBC WITH DIFFERENTIAL/PLATELET
BASO%: 0.3 % (ref 0.0–2.0)
BASOS ABS: 0 10*3/uL (ref 0.0–0.1)
EOS ABS: 0.4 10*3/uL (ref 0.0–0.5)
EOS%: 6.1 % (ref 0.0–7.0)
HCT: 31.4 % — ABNORMAL LOW (ref 38.4–49.9)
HGB: 9.9 g/dL — ABNORMAL LOW (ref 13.0–17.1)
LYMPH%: 28.5 % (ref 14.0–49.0)
MCH: 25.1 pg — ABNORMAL LOW (ref 27.2–33.4)
MCHC: 31.5 g/dL — ABNORMAL LOW (ref 32.0–36.0)
MCV: 79.7 fL (ref 79.3–98.0)
MONO#: 0.6 10*3/uL (ref 0.1–0.9)
MONO%: 9.7 % (ref 0.0–14.0)
NEUT#: 3.4 10*3/uL (ref 1.5–6.5)
NEUT%: 55.4 % (ref 39.0–75.0)
Platelets: 179 10*3/uL (ref 140–400)
RBC: 3.94 10*6/uL — AB (ref 4.20–5.82)
RDW: 17.5 % — ABNORMAL HIGH (ref 11.0–14.6)
WBC: 6.1 10*3/uL (ref 4.0–10.3)
lymph#: 1.7 10*3/uL (ref 0.9–3.3)

## 2013-11-14 LAB — COMPREHENSIVE METABOLIC PANEL (CC13)
ALK PHOS: 75 U/L (ref 40–150)
ALT: <6 U/L (ref 0–55)
AST: 10 U/L (ref 5–34)
Albumin: 3.3 g/dL — ABNORMAL LOW (ref 3.5–5.0)
Anion Gap: 9 mEq/L (ref 3–11)
BUN: 23 mg/dL (ref 7.0–26.0)
CO2: 27 meq/L (ref 22–29)
CREATININE: 1.7 mg/dL — AB (ref 0.7–1.3)
Calcium: 9.5 mg/dL (ref 8.4–10.4)
Chloride: 107 mEq/L (ref 98–109)
Glucose: 124 mg/dl (ref 70–140)
Potassium: 4 mEq/L (ref 3.5–5.1)
SODIUM: 143 meq/L (ref 136–145)
TOTAL PROTEIN: 6.9 g/dL (ref 6.4–8.3)
Total Bilirubin: 0.63 mg/dL (ref 0.20–1.20)

## 2013-11-14 MED ORDER — IOHEXOL 300 MG/ML  SOLN
80.0000 mL | Freq: Once | INTRAMUSCULAR | Status: AC | PRN
  Administered 2013-11-14: 80 mL via INTRAVENOUS

## 2013-11-16 ENCOUNTER — Telehealth: Payer: Self-pay | Admitting: Oncology

## 2013-11-16 ENCOUNTER — Encounter: Payer: Self-pay | Admitting: Oncology

## 2013-11-16 ENCOUNTER — Ambulatory Visit (HOSPITAL_BASED_OUTPATIENT_CLINIC_OR_DEPARTMENT_OTHER): Payer: Commercial Managed Care - PPO | Admitting: Oncology

## 2013-11-16 VITALS — BP 111/58 | HR 62 | Temp 97.6°F | Resp 18 | Ht 69.0 in | Wt 156.8 lb

## 2013-11-16 DIAGNOSIS — C679 Malignant neoplasm of bladder, unspecified: Secondary | ICD-10-CM

## 2013-11-16 NOTE — Telephone Encounter (Signed)
gv and printed appt sched and avs for pt for May thru Sept

## 2013-11-16 NOTE — Progress Notes (Signed)
Hematology and Oncology Follow Up Visit  Kenneth Lawrence [id number] August 25, 1950 63 y.o. 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 He has a 5.1 cm of the right posterior bladder wall and invasion into the perivesicularwith clinical staging T4 N0 disease.   Prior Therapy: On 03/16/2013 he underwent cystourethroscopy and the resection of a 10 cm right lateral bladder wall tumor. He is S/P neoadjuvant chemotherapy with Cisplatin and Gemzar  started on on 04/28/13 and was completed 06/16/2013. He is status post robotic-assisted laparoscopic complete cystectomy, prostatectomy, and ileal conduit formation with lymphadenectomy. This was done on 08/11/2013.  Current therapy: Observation and surveillance.  Interim History:  Kenneth Lawrence presents today for a followup visit. He is a pleasant gentleman with the above history. Since his last visit, he continues to recover from his surgical resection without any complications.  He is recovering quite nicely and almost regained most activities of daily living. He reports no major changes in his appetite or performance status. He is not reporting any urination problems at this time. Has not reported any hematuria or dysuria. Has not reported any peripheral neuropathy or hearing difficulties. He is gaining weight quite nicely and have regained most activities of daily living. He has not reported any headaches or blurry vision or double vision. Has not reported any major changes in his performance status or activity level. Has not reported any abdominal pain or flank pain. Has not reported any hematochezia or melena. Has not reported any changes in his urination. Has not reported any lymphadenopathy or skin changes.  Medications: I have reviewed the patient's current medications.  Current Outpatient Prescriptions  Medication Sig Dispense Refill   . amLODipine (NORVASC) 10 MG tablet Take 10 mg by mouth every morning.      . carvedilol (COREG) 12.5 MG tablet Take 12.5 mg by mouth 2 (two) times daily with a meal.      . hydrochlorothiazide (HYDRODIURIL) 25 MG tablet Take 25 mg by mouth every morning.      . lidocaine-prilocaine (EMLA) cream Apply 1 application topically daily as needed. port-cath      . ondansetron (ZOFRAN) 8 MG tablet Take 8 mg by mouth every 8 (eight) hours as needed for nausea or vomiting.      Marland Kitchen oxyCODONE-acetaminophen (PERCOCET/ROXICET) 5-325 MG per tablet Take 1-2 tablets by mouth every 6 (six) hours as needed for moderate pain. Post-operatively.  40 tablet  0  . potassium chloride Lawrence (K-DUR,KLOR-CON) 20 MEQ tablet Take 20 mEq by mouth daily.      . pravastatin (PRAVACHOL) 20 MG tablet Take 20 mg by mouth every morning.      Marland Kitchen PRESCRIPTION MEDICATION Gemcitabine HCl (GEMZAR) 1,938 mg in sodium chloride 0.9 % 100 mL chemo infusion 1,000 mg/m2  1.92 m2 (Treatment Plan Actual)  Once 06/16/2013      . senna-docusate (SENOKOT-S) 8.6-50 MG per tablet Take 1 tablet by mouth 2 (two) times daily. While taking pain meds to prevent constipation  20 tablet  1   No current facility-administered medications for this visit.     Allergies: No Known Allergies  Past Medical History, Surgical history, Social history, and Family History were reviewed and updated.  Review of Systems:  Remaining ROS negative.  Physical Exam: Blood pressure 111/58, pulse 62, temperature 97.6 F (36.4 C), temperature source Oral, resp. rate 18, height 5\' 9"  (1.753 m), weight 156 lb 12.8 oz (71.124 kg),  SpO2 100.00%. ECOG: 1 General appearance: alert, cooperative and appears stated age Head: Normocephalic, without obvious abnormality, atraumatic Neck: no adenopathy, no carotid bruit, no JVD, supple, symmetrical, trachea midline and thyroid not enlarged, symmetric, no tenderness/mass/nodules Lymph nodes: Cervical, supraclavicular, and axillary nodes  normal. Heart:regular rate and rhythm, S1, S2 normal, no murmur, click, rub or gallop Lung:chest clear, no wheezing, rales, normal symmetric air entry Abdomen: soft, non-tender, without masses or organomegaly EXT:no erythema, induration, or nodules   Lab Results: Lab Results  Component Value Date   WBC 6.1 11/14/2013   HGB 9.9* 11/14/2013   HCT 31.4* 11/14/2013   MCV 79.7 11/14/2013   PLT 179 11/14/2013     Chemistry      Component Value Date/Time   NA 143 11/14/2013 1046   NA 142 08/15/2013 0445   K 4.0 11/14/2013 1046   K 3.6* 08/15/2013 0445   CL 104 08/15/2013 0445   CO2 27 11/14/2013 1046   CO2 26 08/15/2013 0445   BUN 23.0 11/14/2013 1046   BUN 10 08/15/2013 0445   CREATININE 1.7* 11/14/2013 1046   CREATININE 1.42* 08/15/2013 0445      Component Value Date/Time   CALCIUM 9.5 11/14/2013 1046   CALCIUM 8.1* 08/15/2013 0445   ALKPHOS 75 11/14/2013 1046   ALKPHOS 67 08/10/2013 2040   AST 10 11/14/2013 1046   AST 13 08/10/2013 2040   ALT <6 11/14/2013 1046   ALT 9 08/10/2013 2040   BILITOT 0.63 11/14/2013 1046   BILITOT 0.8 08/10/2013 2040     EXAM:  CT CHEST, ABDOMEN, AND PELVIS WITH CONTRAST  TECHNIQUE:  Multidetector CT imaging of the chest, abdomen and pelvis was  performed following the standard protocol during bolus  administration of intravenous contrast.  CONTRAST: 14mL OMNIPAQUE IOHEXOL 300 MG/ML SOLN  COMPARISON: 07/11/2013  FINDINGS:  CT CHEST FINDINGS  No evidence mediastinal or hilar masses. No adenopathy seen  elsewhere within the thorax. No evidence of pleural or pericardial  effusion.  No suspicious pulmonary nodules or masses are identified. No  evidence of pulmonary infiltrate or central endobronchial lesion. No  evidence of chest wall mass or suspicious bone lesions.  CT ABDOMEN AND PELVIS FINDINGS  The liver, gallbladder, pancreas, spleen, and adrenal glands are  normal in appearance. Right percutaneous nephrostomy tube is removed  since prior exam and there is no significant  hydronephrosis or renal  mass.  Postop changes are seen from interval cystoprostatectomy and ileal  loop diversion. No residual soft tissue mass seen within the pelvic  surgical bed. Mild mesenteric adenopathy seen in the right lower  quadrant on image 82 with largest lymph node measuring 10 mm, which  is increased since previous study. No other soft tissue masses or  lymphadenopathy identified within the abdomen or pelvis.  No evidence of inflammatory process or abnormal fluid collections.  No suspicious bone lesions identified.  IMPRESSION:  Expected postop changes from cystoprostatectomy and ileal loop  diversion.  Mild right lower quadrant mesenteric lymphadenopathy, with largest  lymph node measuring 10 mm. Lymph node metastases cannot definitely  be excluded. Consider continued followup by CT versus PET scan for  further evaluation.  No other sites of metastatic disease identified.    Impression and Plan:   63 year old gentleman with the following issues:  1. 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 He received neoadjuvant chemotherapy with cisplatin and Gemzar and underwent a surgical resection on 08/11/2013. His final pathology revealed a  T3 N0 disease without any evidence of lymph node involvement on his lymphadenectomy. The margins were negative at this time. CT scan from 11/14/2013 was discussed today and showed no evidence of disease. I plan on continuing active surveillance and repeat imaging studies in 4 months.  2. IV access: PAC is in place this will continue to be flushed on an intermittent basis and will be removed at his next scan is clear.  3. Acute renal failure: This has resolved. His creatinine close to baseline.  4. Follow-up: He will return in 03/2014 after his CT scan.  Wyatt Portela 5/7/20159:19 AM

## 2013-11-22 ENCOUNTER — Ambulatory Visit (HOSPITAL_BASED_OUTPATIENT_CLINIC_OR_DEPARTMENT_OTHER): Payer: Commercial Managed Care - PPO

## 2013-11-22 VITALS — BP 127/61 | HR 64 | Temp 97.0°F

## 2013-11-22 DIAGNOSIS — C679 Malignant neoplasm of bladder, unspecified: Secondary | ICD-10-CM

## 2013-11-22 DIAGNOSIS — Z452 Encounter for adjustment and management of vascular access device: Secondary | ICD-10-CM

## 2013-11-22 DIAGNOSIS — Z95828 Presence of other vascular implants and grafts: Secondary | ICD-10-CM

## 2013-11-22 MED ORDER — HEPARIN SOD (PORK) LOCK FLUSH 100 UNIT/ML IV SOLN
500.0000 [IU] | Freq: Once | INTRAVENOUS | Status: AC
  Administered 2013-11-22: 500 [IU] via INTRAVENOUS
  Filled 2013-11-22: qty 5

## 2013-11-22 MED ORDER — SODIUM CHLORIDE 0.9 % IJ SOLN
10.0000 mL | INTRAMUSCULAR | Status: DC | PRN
Start: 2013-11-22 — End: 2013-11-22
  Administered 2013-11-22: 10 mL via INTRAVENOUS
  Filled 2013-11-22: qty 10

## 2013-11-22 NOTE — Patient Instructions (Signed)

## 2014-01-03 ENCOUNTER — Ambulatory Visit (HOSPITAL_BASED_OUTPATIENT_CLINIC_OR_DEPARTMENT_OTHER): Payer: Commercial Managed Care - PPO

## 2014-01-03 VITALS — BP 117/64 | HR 68 | Temp 97.5°F

## 2014-01-03 DIAGNOSIS — Z452 Encounter for adjustment and management of vascular access device: Secondary | ICD-10-CM

## 2014-01-03 DIAGNOSIS — Z95828 Presence of other vascular implants and grafts: Secondary | ICD-10-CM

## 2014-01-03 DIAGNOSIS — C679 Malignant neoplasm of bladder, unspecified: Secondary | ICD-10-CM

## 2014-01-03 MED ORDER — HEPARIN SOD (PORK) LOCK FLUSH 100 UNIT/ML IV SOLN
500.0000 [IU] | Freq: Once | INTRAVENOUS | Status: AC
  Administered 2014-01-03: 500 [IU] via INTRAVENOUS
  Filled 2014-01-03: qty 5

## 2014-01-03 MED ORDER — SODIUM CHLORIDE 0.9 % IJ SOLN
10.0000 mL | INTRAMUSCULAR | Status: DC | PRN
  Administered 2014-01-03: 10 mL via INTRAVENOUS
  Filled 2014-01-03: qty 10

## 2014-01-03 NOTE — Patient Instructions (Signed)
Implanted Port Home Guide  An implanted port is a type of central line that is placed under the skin. Central lines are used to provide IV access when treatment or nutrition needs to be given through a person's veins. Implanted ports are used for long-term IV access. An implanted port may be placed because:    You need IV medicine that would be irritating to the small veins in your hands or arms.    You need long-term IV medicines, such as antibiotics.    You need IV nutrition for a long period.    You need frequent blood draws for lab tests.    You need dialysis.   Implanted ports are usually placed in the chest area, but they can also be placed in the upper arm, the abdomen, or the leg. An implanted port has two main parts:    Reservoir. The reservoir is round and will appear as a small, raised area under your skin. The reservoir is the part where a needle is inserted to give medicines or draw blood.    Catheter. The catheter is a thin, flexible tube that extends from the reservoir. The catheter is placed into a large vein. Medicine that is inserted into the reservoir goes into the catheter and then into the vein.   HOW WILL I CARE FOR MY INCISION SITE?  Do not get the incision site wet. Bathe or shower as directed by your health care Caiden Arteaga.   HOW IS MY PORT ACCESSED?  Special steps must be taken to access the port:    Before the port is accessed, a numbing cream can be placed on the skin. This helps numb the skin over the port site.    Your health care Lumen Brinlee uses a sterile technique to access the port.   Your health care Yaa Donnellan must put on a mask and sterile gloves.   The skin over your port is cleaned carefully with an antiseptic and allowed to dry.   The port is gently pinched between sterile gloves, and a needle is inserted into the port.   Only "non-coring" port needles should be used to access the port. Once the port is accessed, a blood return should be checked. This helps  ensure that the port is in the vein and is not clogged.    If your port needs to remain accessed for a constant infusion, a clear (transparent) bandage will be placed over the needle site. The bandage and needle will need to be changed every week, or as directed by your health care Sehaj Mcenroe.    Keep the bandage covering the needle clean and dry. Do not get it wet. Follow your health care Lyle Niblett's instructions on how to take a shower or bath while the port is accessed.    If your port does not need to stay accessed, no bandage is needed over the port.   WHAT IS FLUSHING?  Flushing helps keep the port from getting clogged. Follow your health care Edris Friedt's instructions on how and when to flush the port. Ports are usually flushed with saline solution or a medicine called heparin. The need for flushing will depend on how the port is used.    If the port is used for intermittent medicines or blood draws, the port will need to be flushed:    After medicines have been given.    After blood has been drawn.    As part of routine maintenance.    If a constant infusion is   running, the port may not need to be flushed.   HOW LONG WILL MY PORT STAY IMPLANTED?  The port can stay in for as long as your health care Reianna Batdorf thinks it is needed. When it is time for the port to come out, surgery will be done to remove it. The procedure is similar to the one performed when the port was put in.   WHEN SHOULD I SEEK IMMEDIATE MEDICAL CARE?  When you have an implanted port, you should seek immediate medical care if:    You notice a bad smell coming from the incision site.    You have swelling, redness, or drainage at the incision site.    You have more swelling or pain at the port site or the surrounding area.    You have a fever that is not controlled with medicine.  Document Released: 06/29/2005 Document Revised: 04/19/2013 Document Reviewed: 03/06/2013  [employer] Patient Information 2015 [employer]. This  information is not intended to replace advice given to you by your health care TRUE Shackleford. Make sure you discuss any questions you have with your health care Rajesh Wyss.

## 2014-02-14 ENCOUNTER — Ambulatory Visit (HOSPITAL_BASED_OUTPATIENT_CLINIC_OR_DEPARTMENT_OTHER): Payer: Commercial Managed Care - PPO

## 2014-02-14 VITALS — BP 114/67 | HR 68 | Temp 97.3°F

## 2014-02-14 DIAGNOSIS — Z452 Encounter for adjustment and management of vascular access device: Secondary | ICD-10-CM

## 2014-02-14 DIAGNOSIS — C679 Malignant neoplasm of bladder, unspecified: Secondary | ICD-10-CM

## 2014-02-14 DIAGNOSIS — Z95828 Presence of other vascular implants and grafts: Secondary | ICD-10-CM

## 2014-02-14 MED ORDER — SODIUM CHLORIDE 0.9 % IJ SOLN
10.0000 mL | INTRAMUSCULAR | Status: DC | PRN
  Administered 2014-02-14: 10 mL via INTRAVENOUS
  Filled 2014-02-14: qty 10

## 2014-02-14 MED ORDER — HEPARIN SOD (PORK) LOCK FLUSH 100 UNIT/ML IV SOLN
500.0000 [IU] | Freq: Once | INTRAVENOUS | Status: AC
  Administered 2014-02-14: 500 [IU] via INTRAVENOUS
  Filled 2014-02-14: qty 5

## 2014-02-14 NOTE — Patient Instructions (Signed)

## 2014-03-28 ENCOUNTER — Ambulatory Visit (HOSPITAL_BASED_OUTPATIENT_CLINIC_OR_DEPARTMENT_OTHER): Payer: Commercial Managed Care - PPO

## 2014-03-28 ENCOUNTER — Ambulatory Visit (HOSPITAL_COMMUNITY)
Admission: RE | Admit: 2014-03-28 | Discharge: 2014-03-28 | Disposition: A | Discharge status: Home / Self Care | Payer: Commercial Managed Care - PPO | Source: Ambulatory Visit | Attending: Oncology | Admitting: Oncology

## 2014-03-28 ENCOUNTER — Other Ambulatory Visit (HOSPITAL_BASED_OUTPATIENT_CLINIC_OR_DEPARTMENT_OTHER): Payer: Commercial Managed Care - PPO

## 2014-03-28 VITALS — BP 112/68 | HR 71 | Temp 98.3°F

## 2014-03-28 DIAGNOSIS — C679 Malignant neoplasm of bladder, unspecified: Secondary | ICD-10-CM | POA: Diagnosis present

## 2014-03-28 DIAGNOSIS — Z9079 Acquired absence of other genital organ(s): Secondary | ICD-10-CM | POA: Diagnosis not present

## 2014-03-28 DIAGNOSIS — M5137 Other intervertebral disc degeneration, lumbosacral region: Secondary | ICD-10-CM | POA: Insufficient documentation

## 2014-03-28 DIAGNOSIS — Z9221 Personal history of antineoplastic chemotherapy: Secondary | ICD-10-CM | POA: Diagnosis not present

## 2014-03-28 DIAGNOSIS — K219 Gastro-esophageal reflux disease without esophagitis: Secondary | ICD-10-CM | POA: Insufficient documentation

## 2014-03-28 DIAGNOSIS — I2510 Atherosclerotic heart disease of native coronary artery without angina pectoris: Secondary | ICD-10-CM | POA: Insufficient documentation

## 2014-03-28 DIAGNOSIS — Z906 Acquired absence of other parts of urinary tract: Secondary | ICD-10-CM | POA: Diagnosis not present

## 2014-03-28 DIAGNOSIS — Z95828 Presence of other vascular implants and grafts: Secondary | ICD-10-CM

## 2014-03-28 DIAGNOSIS — Z452 Encounter for adjustment and management of vascular access device: Secondary | ICD-10-CM

## 2014-03-28 DIAGNOSIS — J984 Other disorders of lung: Secondary | ICD-10-CM | POA: Insufficient documentation

## 2014-03-28 DIAGNOSIS — M51379 Other intervertebral disc degeneration, lumbosacral region without mention of lumbar back pain or lower extremity pain: Secondary | ICD-10-CM | POA: Insufficient documentation

## 2014-03-28 LAB — CBC WITH DIFFERENTIAL/PLATELET
BASO%: 0.3 % (ref 0.0–2.0)
Basophils Absolute: 0 10*3/uL (ref 0.0–0.1)
EOS ABS: 0.3 10*3/uL (ref 0.0–0.5)
EOS%: 5.1 % (ref 0.0–7.0)
HEMATOCRIT: 32.1 % — AB (ref 38.4–49.9)
HEMOGLOBIN: 10.3 g/dL — AB (ref 13.0–17.1)
LYMPH#: 1.4 10*3/uL (ref 0.9–3.3)
LYMPH%: 24.5 % (ref 14.0–49.0)
MCH: 25.6 pg — ABNORMAL LOW (ref 27.2–33.4)
MCHC: 32.1 g/dL (ref 32.0–36.0)
MCV: 79.7 fL (ref 79.3–98.0)
MONO#: 0.6 10*3/uL (ref 0.1–0.9)
MONO%: 10.8 % (ref 0.0–14.0)
NEUT%: 59.3 % (ref 39.0–75.0)
NEUTROS ABS: 3.4 10*3/uL (ref 1.5–6.5)
PLATELETS: 180 10*3/uL (ref 140–400)
RBC: 4.03 10*6/uL — ABNORMAL LOW (ref 4.20–5.82)
RDW: 15.5 % — ABNORMAL HIGH (ref 11.0–14.6)
WBC: 5.7 10*3/uL (ref 4.0–10.3)

## 2014-03-28 LAB — COMPREHENSIVE METABOLIC PANEL (CC13)
ALT: <6 U/L (ref 0–55)
ANION GAP: 10 meq/L (ref 3–11)
AST: 8 U/L (ref 5–34)
Albumin: 3.4 g/dL — ABNORMAL LOW (ref 3.5–5.0)
Alkaline Phosphatase: 76 U/L (ref 40–150)
BUN: 23.7 mg/dL (ref 7.0–26.0)
CO2: 26 meq/L (ref 22–29)
CREATININE: 1.5 mg/dL — AB (ref 0.7–1.3)
Calcium: 9 mg/dL (ref 8.4–10.4)
Chloride: 105 mEq/L (ref 98–109)
GLUCOSE: 112 mg/dL (ref 70–140)
Potassium: 3.5 mEq/L (ref 3.5–5.1)
Sodium: 141 mEq/L (ref 136–145)
TOTAL PROTEIN: 6.9 g/dL (ref 6.4–8.3)
Total Bilirubin: 0.72 mg/dL (ref 0.20–1.20)

## 2014-03-28 MED ORDER — SODIUM CHLORIDE 0.9 % IJ SOLN
10.0000 mL | INTRAMUSCULAR | Status: DC | PRN
  Administered 2014-03-28: 10 mL via INTRAVENOUS
  Filled 2014-03-28: qty 10

## 2014-03-28 MED ORDER — HEPARIN SOD (PORK) LOCK FLUSH 100 UNIT/ML IV SOLN
500.0000 [IU] | Freq: Once | INTRAVENOUS | Status: AC
  Administered 2014-03-28: 500 [IU] via INTRAVENOUS
  Filled 2014-03-28: qty 5

## 2014-03-28 NOTE — Patient Instructions (Signed)

## 2014-03-30 ENCOUNTER — Encounter: Payer: Self-pay | Admitting: Oncology

## 2014-03-30 ENCOUNTER — Ambulatory Visit (HOSPITAL_BASED_OUTPATIENT_CLINIC_OR_DEPARTMENT_OTHER): Payer: Commercial Managed Care - PPO | Admitting: Oncology

## 2014-03-30 ENCOUNTER — Telehealth: Payer: Self-pay | Admitting: Oncology

## 2014-03-30 VITALS — BP 119/60 | HR 61 | Temp 98.3°F | Resp 18 | Ht 69.0 in | Wt 155.4 lb

## 2014-03-30 DIAGNOSIS — C679 Malignant neoplasm of bladder, unspecified: Secondary | ICD-10-CM

## 2014-03-30 DIAGNOSIS — R918 Other nonspecific abnormal finding of lung field: Secondary | ICD-10-CM

## 2014-03-30 NOTE — Progress Notes (Signed)
Hematology and Oncology Follow Up Visit  Kenneth Lawrence [id number] 12/28/1950 63 y.o. 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 He has a 5.1 cm of the right posterior bladder wall and invasion into the perivesicularwith clinical staging T4 N0 disease.   Prior Therapy: On 03/16/2013 he underwent cystourethroscopy and the resection of a 10 cm right lateral bladder wall tumor. He is S/P neoadjuvant chemotherapy with Cisplatin and Gemzar  started on on 04/28/13 and was completed 06/16/2013. He is status post robotic-assisted laparoscopic complete cystectomy, prostatectomy, and ileal conduit formation with lymphadenectomy. This was done on 08/11/2013.  Current therapy: Observation and surveillance.  Interim History:  Kenneth Lawrence presents today for a followup visit. Since his last visit, he reports complaints predominantly related to pelvic pain and discomfort. He reported some constipation and some occasional gas.Marland Kitchen He reports no major changes in his appetite or performance status. He is not reporting any urination problems at this time. Has not reported any hematuria or dysuria. Has not reported any peripheral neuropathy or hearing difficulties. He is gaining weight quite nicely and have regained most activities of daily living. He has not reported any headaches or blurry vision or double vision. Has not reported any major changes in his performance status or activity level. Has not reported any abdominal pain or flank pain. Has not reported any hematochezia or melena. Has not reported any changes in his urination. Has not reported any lymphadenopathy or skin changes. He has not reported any respiratory symptoms although he continues to smoke. He does not report any cough or hemoptysis. Has not reported any recent infections. The rest of the review of systems  unremarkable.  Medications: I have reviewed the patient's current medications.  Current Outpatient Prescriptions  Medication Sig Dispense Refill  . amLODipine (NORVASC) 10 MG tablet Take 10 mg by mouth every morning.      . carvedilol (COREG) 12.5 MG tablet Take 12.5 mg by mouth 2 (two) times daily with a meal.      . hydrochlorothiazide (HYDRODIURIL) 25 MG tablet Take 25 mg by mouth every morning.      . lidocaine-prilocaine (EMLA) cream Apply 1 application topically daily as needed. port-cath      . ondansetron (ZOFRAN) 8 MG tablet Take 8 mg by mouth every 8 (eight) hours as needed for nausea or vomiting.      Marland Kitchen oxyCODONE-acetaminophen (PERCOCET/ROXICET) 5-325 MG per tablet Take 1-2 tablets by mouth every 6 (six) hours as needed for moderate pain. Post-operatively.  40 tablet  0  . potassium chloride Lawrence (K-DUR,KLOR-CON) 20 MEQ tablet Take 20 mEq by mouth daily.      . pravastatin (PRAVACHOL) 20 MG tablet Take 20 mg by mouth every morning.      Marland Kitchen PRESCRIPTION MEDICATION Gemcitabine HCl (GEMZAR) 1,938 mg in sodium chloride 0.9 % 100 mL chemo infusion 1,000 mg/m2  1.92 m2 (Treatment Plan Actual)  Once 06/16/2013      . senna-docusate (SENOKOT-S) 8.6-50 MG per tablet Take 1 tablet by mouth 2 (two) times daily. While taking pain meds to prevent constipation  20 tablet  1   No current facility-administered medications for this visit.     Allergies: No Known Allergies  Past Medical History, Surgical history, Social history, and Family History were reviewed and updated.    Physical Exam: Blood pressure 119/60, pulse 61, temperature 98.3 F (36.8 C), temperature source Oral, resp. rate 18,  height 5\' 9"  (1.753 m), weight 155 lb 6.4 oz (70.489 kg), SpO2 100.00%. ECOG: 1 General appearance: alert, cooperative and appears stated age Head: Normocephalic, without obvious abnormality, atraumatic Neck: no adenopathy Lymph nodes: Cervical, supraclavicular, and axillary nodes normal. Heart:regular  rate and rhythm, S1, S2 normal, no murmur, click, rub or gallop Lung:chest clear, no wheezing, rales, normal symmetric air entry Abdomen: soft, non-tender, without masses or organomegaly EXT:no erythema, induration, or nodules   Lab Results: Lab Results  Component Value Date   WBC 5.7 03/28/2014   HGB 10.3* 03/28/2014   HCT 32.1* 03/28/2014   MCV 79.7 03/28/2014   PLT 180 03/28/2014     Chemistry      Component Value Date/Time   NA 141 03/28/2014 1019   NA 142 08/15/2013 0445   K 3.5 03/28/2014 1019   K 3.6* 08/15/2013 0445   CL 104 08/15/2013 0445   CO2 26 03/28/2014 1019   CO2 26 08/15/2013 0445   BUN 23.7 03/28/2014 1019   BUN 10 08/15/2013 0445   CREATININE 1.5* 03/28/2014 1019   CREATININE 1.42* 08/15/2013 0445      Component Value Date/Time   CALCIUM 9.0 03/28/2014 1019   CALCIUM 8.1* 08/15/2013 0445   ALKPHOS 76 03/28/2014 1019   ALKPHOS 67 08/10/2013 2040   AST 8 03/28/2014 1019   AST 13 08/10/2013 2040   ALT <6 03/28/2014 1019   ALT 9 08/10/2013 2040   BILITOT 0.72 03/28/2014 1019   BILITOT 0.8 08/10/2013 2040       EXAM:  CT CHEST, ABDOMEN AND PELVIS WITHOUT CONTRAST  TECHNIQUE:  Multidetector CT imaging of the chest, abdomen and pelvis was  performed following the standard protocol without IV contrast.  COMPARISON: Multiple exams, including 11/14/2013  FINDINGS:  CT CHEST FINDINGS  Atherosclerotic aortic arch. contrast medium in the mid esophagus  compatible with gastroesophageal reflux.  Scattered faint coronary artery atherosclerotic calcifications are  present. Right-sided Port-A-Cath tip: SVC.  No pathologic thoracic adenopathy.  New 1.2 x 1.2 cm nodular density in the lateral basal segment right  lower lobe, image 48 of series 4, essentially in the costophrenic  sulcus. New peripheral 2.3 x 1.4 cm primarily sub solid left upper  lobe peripheral density, image 33 series 4. Subtle 5 mm subpleural  nodule in the superior segment left lower lobe, image 22 of series  4,  probably sub solid.  Suspected free osteochondral fragment along the left glenoid margin  medially, chronic.  CT ABDOMEN AND PELVIS FINDINGS  Hepatobiliary: Intact  Spleen: Intact  Pancreas: Intact  Stomach/Bowel: Prominent stool throughout the colon favors  constipation. Visualized segment of the appendix normal.  Adrenals/urinary tract: Postoperative findings from ileal loop  diversion noted. No overt hydronephrosis. No renal calculi. Prior  cystic prostatectomy. Stable atrophic appearance the right kidney.  Vascular/Lymphatic: Aortoiliac atherosclerotic vascular disease.  Previous mildly prominent pericecal lymph nodes are stable, with the  largest at 10 mm in short axis. No new adenopathy. Stable indistinct  small left inguinal lymph nodes with surrounding stranding along the  pubis and left inguinal region.  Reproductive: Cystoprostatectomy noted.  Musculoskeletal: Sacroiliac joint spurring. Abnormal ground-glass  density in the left inferior pubic ramus, as on images 45 through 53  of series 602.  Mild degenerative retrolisthesis at L5-S1 with mild loss of  intervertebral disc height.  IMPRESSION:  1. Abnormal ground-glass density in the left inferior pubic ramus,  conceivably from metastatic disease, infection, or progressive  fibrous dysplasia. Given the findings in  the chest, consider nuclear  medicine PET-CT for further characterization of this and other  findings.  2. Stable borderline prominent pericecal lymph nodes on the right.  3. Gastroesophageal reflux  4. New 1.2 cm nodular density in the lateral basal segment right  lower lobe. New peripheral sub solid left upper lobe density  measures 2.3 x 1.4 cm. Although possibly inflammatory, neoplastic  disease is considered a possibility for the right-sided lesion.     Impression and Plan:   63 year old gentleman with the following issues:  1. Locally advanced transitional cell carcinoma of the bladder with a tumor  representing T4 disease. He received neoadjuvant chemotherapy with cisplatin and Gemzar and underwent a surgical resection on 08/11/2013. His final pathology revealed a T3 N0 disease without any evidence of lymph node involvement on his lymphadenectomy. The margins were negative at this time. CT scan from 03/28/2014 was discussed today. There is no clear-cut metastatic disease there are a few suspicious findings. Predominately in the lung nodules that could be inflammatory versus malignant also an abnormality in the left inferior pubic rami. Based on radiology's recommendation we will proceed with a PET/CT scan to further evaluate these findings. Differential diagnosis including inflammatory changes versus reactive findings versus malignancy. Malignancy could be metastatic disease versus new lung primary.   2. IV access: PAC is in place this will continue to be flushed on an intermittent basis and will be removed at his next scan is clear.  3. Acute renal failure: This has resolved. His creatinine close to baseline.  4. Follow-up: After a PET/CT scan.  Doriana Mazurkiewicz 9/18/20159:37 AM

## 2014-03-30 NOTE — Telephone Encounter (Signed)
gv and printed appt sched and avs for pt for OCT. °

## 2014-05-01 ENCOUNTER — Ambulatory Visit (HOSPITAL_COMMUNITY): Payer: Commercial Managed Care - PPO

## 2014-05-01 ENCOUNTER — Ambulatory Visit (HOSPITAL_COMMUNITY)
Admission: RE | Admit: 2014-05-01 | Discharge: 2014-05-01 | Disposition: A | Discharge status: Home / Self Care | Payer: Commercial Managed Care - PPO | Source: Ambulatory Visit | Attending: Oncology | Admitting: Oncology

## 2014-05-01 DIAGNOSIS — M899 Disorder of bone, unspecified: Secondary | ICD-10-CM | POA: Diagnosis not present

## 2014-05-01 DIAGNOSIS — R918 Other nonspecific abnormal finding of lung field: Secondary | ICD-10-CM

## 2014-05-01 DIAGNOSIS — Z8551 Personal history of malignant neoplasm of bladder: Secondary | ICD-10-CM | POA: Insufficient documentation

## 2014-05-01 DIAGNOSIS — N329 Bladder disorder, unspecified: Secondary | ICD-10-CM | POA: Diagnosis not present

## 2014-05-01 LAB — GLUCOSE, CAPILLARY: GLUCOSE-CAPILLARY: 111 mg/dL — AB (ref 70–99)

## 2014-05-01 MED ORDER — FLUDEOXYGLUCOSE F - 18 (FDG) INJECTION
7.7000 | Freq: Once | INTRAVENOUS | Status: AC | PRN
  Administered 2014-05-01: 7.7 via INTRAVENOUS

## 2014-05-08 ENCOUNTER — Ambulatory Visit (HOSPITAL_BASED_OUTPATIENT_CLINIC_OR_DEPARTMENT_OTHER): Payer: Commercial Managed Care - PPO | Admitting: Oncology

## 2014-05-08 ENCOUNTER — Telehealth: Payer: Self-pay | Admitting: Oncology

## 2014-05-08 VITALS — BP 128/57 | HR 72 | Temp 98.2°F | Resp 18 | Ht 69.0 in | Wt 157.1 lb

## 2014-05-08 DIAGNOSIS — C679 Malignant neoplasm of bladder, unspecified: Secondary | ICD-10-CM

## 2014-05-08 NOTE — Progress Notes (Signed)
Hematology and Oncology Follow Up Visit  HERON PITCOCK [id number] [birth year] 63 y.o. 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 He has a 5.1 cm of the right posterior bladder wall and invasion into the perivesicularwith clinical staging T4 N0 disease.   Prior Therapy: On 03/16/2013 he underwent cystourethroscopy and the resection of a 10 cm right lateral bladder wall tumor. He is S/P neoadjuvant chemotherapy with Cisplatin and Gemzar  started on on 04/28/13 and was completed 06/16/2013. He is status post robotic-assisted laparoscopic complete cystectomy, prostatectomy, and ileal conduit formation with lymphadenectomy. This was done on 08/11/2013.  Current therapy: Observation and surveillance.  Interim History:  Mr. Lindholm presents today for a followup visit. Since his last visit, he has been doing relatively well with very few complaints. His hip pain is actually slightly improved at this time. He reported some constipation which have improved after a CT scan. He reports no major changes in his appetite or performance status. He is not reporting any urination problems at this time. Has not reported any hematuria or dysuria. Has not reported any peripheral neuropathy or hearing difficulties. He is gaining weight quite nicely and have regained most activities of daily living. He has not reported any headaches or blurry vision or double vision. Has not reported any major changes in his performance status or activity level. Has not reported any abdominal pain or flank pain. Has not reported any hematochezia or melena. Has not reported any changes in his urination. Has not reported any lymphadenopathy or skin changes. He has not reported any respiratory symptoms although he continues to smoke. He does not report any cough or hemoptysis. Has not reported any recent infections.  The rest of the review of systems unremarkable.  Medications: I have reviewed the patient's current medications.  Current Outpatient Prescriptions  Medication Sig Dispense Refill  . amLODipine (NORVASC) 10 MG tablet Take 10 mg by mouth every morning.      . carvedilol (COREG) 12.5 MG tablet Take 12.5 mg by mouth 2 (two) times daily with a meal.      . hydrochlorothiazide (HYDRODIURIL) 25 MG tablet Take 25 mg by mouth every morning.      . lidocaine-prilocaine (EMLA) cream Apply 1 application topically daily as needed. port-cath      . ondansetron (ZOFRAN) 8 MG tablet Take 8 mg by mouth every 8 (eight) hours as needed for nausea or vomiting.      Marland Kitchen oxyCODONE-acetaminophen (PERCOCET/ROXICET) 5-325 MG per tablet Take 1-2 tablets by mouth every 6 (six) hours as needed for moderate pain. Post-operatively.  40 tablet  0  . potassium chloride SA (K-DUR,KLOR-CON) 20 MEQ tablet Take 20 mEq by mouth daily.      . pravastatin (PRAVACHOL) 20 MG tablet Take 20 mg by mouth every morning.      Marland Kitchen PRESCRIPTION MEDICATION Gemcitabine HCl (GEMZAR) 1,938 mg in sodium chloride 0.9 % 100 mL chemo infusion 1,000 mg/m2  1.92 m2 (Treatment Plan Actual)  Once 06/16/2013      . senna-docusate (SENOKOT-S) 8.6-50 MG per tablet Take 1 tablet by mouth 2 (two) times daily. While taking pain meds to prevent constipation  20 tablet  1   No current facility-administered medications for this visit.     Allergies: No Known Allergies  Past Medical History, Surgical history, Social history, and Family History were reviewed and updated.    Physical Exam: Blood pressure 128/57,  pulse 72, temperature 98.2 F (36.8 C), temperature source Oral, resp. rate 18, height 5\' 9"  (1.753 m), weight 157 lb 1.6 oz (71.26 kg), SpO2 100.00%. ECOG: 1 General appearance: alert and cooperative Head: Normocephalic, without obvious abnormality, atraumatic Neck: no adenopathy Lymph nodes: Cervical, supraclavicular, and axillary nodes  normal. Heart:regular rate and rhythm, S1, S2 normal, no murmur, click, rub or gallop Lung:chest clear, no wheezing, rales, normal symmetric air entry Abdomen: soft, non-tender, without masses or organomegaly EXT:no erythema, induration, or nodules   Lab Results: Lab Results  Component Value Date   WBC 5.7 03/28/2014   HGB 10.3* 03/28/2014   HCT 32.1* 03/28/2014   MCV 79.7 03/28/2014   PLT 180 03/28/2014     Chemistry      Component Value Date/Time   NA 141 03/28/2014 1019   NA 142 08/15/2013 0445   K 3.5 03/28/2014 1019   K 3.6* 08/15/2013 0445   CL 104 08/15/2013 0445   CO2 26 03/28/2014 1019   CO2 26 08/15/2013 0445   BUN 23.7 03/28/2014 1019   BUN 10 08/15/2013 0445   CREATININE 1.5* 03/28/2014 1019   CREATININE 1.42* 08/15/2013 0445      Component Value Date/Time   CALCIUM 9.0 03/28/2014 1019   CALCIUM 8.1* 08/15/2013 0445   ALKPHOS 76 03/28/2014 1019   ALKPHOS 67 08/10/2013 2040   AST 8 03/28/2014 1019   AST 13 08/10/2013 2040   ALT <6 03/28/2014 1019   ALT 9 08/10/2013 2040   BILITOT 0.72 03/28/2014 1019   BILITOT 0.8 08/10/2013 2040      EXAM:  NUCLEAR MEDICINE PET SKULL BASE TO THIGH  TECHNIQUE:  7.7 mCi F-18 FDG was injected intravenously. Full-ring PET imaging  was performed from the skull base to thigh after the radiotracer. CT  data was obtained and used for attenuation correction and anatomic  localization.  FASTING BLOOD GLUCOSE: Value: 111 mg/dl  COMPARISON: Chest abdomen and pelvic CTs ranging from 07/11/2013  through 03/28/2014.  FINDINGS:  NECK  No hypermetabolic cervical lymph nodes are identified.There are no  lesions of the pharyngeal mucosal space. Slightly asymmetric  activity associated with the muscles of pronation is likely  physiologic. There is no corresponding abnormality on the CT images.  CHEST  There are no hypermetabolic mediastinal, hilar or axillary lymph  nodes. There is low-level activity within the previously  demonstrated sub solid left upper  lobe lesion. However, this lesion  has decreased in size, now measuring 1.5 x 0.8 cm on image 35. SUV  max is 1.6. There is no abnormal metabolic activity within the  subpleural nodule laterally at the right costophrenic angle. This  measures approximately 1.3 cm on image 48 and is grossly unchanged.  There are no new or enlarging pulmonary nodules.  ABDOMEN/PELVIS  There is no hypermetabolic activity within the liver, adrenal  glands, spleen or pancreas. No hypermetabolic lymph nodes are  identified within the abdomen. However, there are several abnormal  areas of hypermetabolic activity within the pelvic peritoneum which  appear separate from the urinary diversion. Posterior to the  proximal sigmoid colon is a 1.4 cm soft tissue nodule on image 160  which demonstrates an SUV max of 6.0. Medial to the right external  iliac vessels is a 1.5 cm soft tissue nodule on image 171 which has  an SUV max of 7.7. There is hypermetabolic activity inferior medial  to the right piriformis muscle, possibly associated with surgical  clips. This has an SUV max of  8.2. Within the cystoprostatectomy  bed, there is abnormal soft tissue which demonstrates increased  metabolic activity and an SUV max of 12.1.  SKELETON  There is a mixed sclerotic and lytic lesion involving the left  inferior pubic ramus which is hypermetabolic and worrisome for  metastatic disease. This demonstrates stenosis the max of 13.2. No  other hypermetabolic osseous lesions demonstrated.  IMPRESSION:  1. There are several hypermetabolic peritoneal soft tissue nodules  in the pelvis which are worrisome for metastatic bladder cancer. The  intensity is greater than expected for postsurgical change, and  cannot be explained by the prior urinary diversion. The components  within the cystoprostatectomy bed are likely amenable to transrectal  ultrasound-guided biopsy.  2. Hypermetabolic mixed lytic and sclerotic lesion within the left   inferior pubic ramus worrisome for metastatic disease.  3. The pulmonary findings are unlikely to reflect metastatic  disease. The nodule at the right costophrenic angle does not show  any increased metabolic activity. The sub solid lesion in the left  upper lobe is mildly hypermetabolic, although has partially  contracted compared with the CT performed 5 weeks ago and is  probably a resolving postinflammatory focus.     Impression and Plan:   63 year old gentleman with the following issues:  1. 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 He received neoadjuvant chemotherapy with cisplatin and Gemzar and underwent a surgical resection on 08/11/2013. His final pathology revealed a T3 N0 disease without any evidence of lymph node involvement on his lymphadenectomy. The margins were negative at this time. CT scan from 03/28/2014 showed possible recurrence in the pelvic area. PET CT scan obtained on 05/01/2014 and was discussed extensively. Images were shown to the patient today and showed several hypermetabolic peritoneal soft tissue nodules. There is also clearcut involvement of the left inferior pubic rami that is worrisome for metastatic disease.  The implication of these findings were discussed today with the patient and his wife. If these lesions are indeed cancerous then we are likely dealing with an incurable malignancy. Options of treatment at this time would be after confirming recurrence with with a biopsy, including systemic chemotherapy, observation or palliative radiation therapy. The risks and benefits of all these options were weighed today after a lengthy discussion.  The plan at this point is to proceed with a biopsy to confirm local recurrence. He is agreeable for a radiation oncology appointment for at least an evaluation to discuss the role of local control of his disease. I will also defer systemic chemotherapy at least for  the immediate future given the fact that he does not have widespread systemic disease. He is agreeable for this current plan.  2. IV access: PAC is in place this will continue to be flushed on an intermittent basis.  3. Acute renal failure: This has resolved. His creatinine close to baseline.  4. Follow-up: In 1 month to check on his status.  Carrolyn Hilmes 10/27/201510:21 AM

## 2014-05-08 NOTE — Telephone Encounter (Signed)
Gave AVS & Cal for Nov. °

## 2014-05-18 ENCOUNTER — Other Ambulatory Visit: Payer: Self-pay | Admitting: Radiology

## 2014-05-21 ENCOUNTER — Ambulatory Visit (HOSPITAL_COMMUNITY)
Admission: RE | Admit: 2014-05-21 | Discharge: 2014-05-21 | Disposition: A | Discharge status: Home / Self Care | Payer: Commercial Managed Care - PPO | Source: Ambulatory Visit | Attending: Oncology | Admitting: Oncology

## 2014-05-21 ENCOUNTER — Other Ambulatory Visit: Payer: Self-pay | Admitting: Oncology

## 2014-05-21 ENCOUNTER — Encounter (HOSPITAL_COMMUNITY): Payer: Self-pay

## 2014-05-21 DIAGNOSIS — C679 Malignant neoplasm of bladder, unspecified: Secondary | ICD-10-CM | POA: Insufficient documentation

## 2014-05-21 DIAGNOSIS — C786 Secondary malignant neoplasm of retroperitoneum and peritoneum: Secondary | ICD-10-CM | POA: Insufficient documentation

## 2014-05-21 DIAGNOSIS — R1900 Intra-abdominal and pelvic swelling, mass and lump, unspecified site: Secondary | ICD-10-CM | POA: Diagnosis present

## 2014-05-21 DIAGNOSIS — Z8551 Personal history of malignant neoplasm of bladder: Secondary | ICD-10-CM | POA: Insufficient documentation

## 2014-05-21 LAB — CBC
HCT: 34.8 % — ABNORMAL LOW (ref 39.0–52.0)
HEMOGLOBIN: 11.3 g/dL — AB (ref 13.0–17.0)
MCH: 25.9 pg — ABNORMAL LOW (ref 26.0–34.0)
MCHC: 32.5 g/dL (ref 30.0–36.0)
MCV: 79.8 fL (ref 78.0–100.0)
PLATELETS: 314 10*3/uL (ref 150–400)
RBC: 4.36 MIL/uL (ref 4.22–5.81)
RDW: 15.4 % (ref 11.5–15.5)
WBC: 9.4 10*3/uL (ref 4.0–10.5)

## 2014-05-21 LAB — PROTIME-INR
INR: 1.01 (ref 0.00–1.49)
Prothrombin Time: 13.4 seconds (ref 11.6–15.2)

## 2014-05-21 LAB — APTT: APTT: 31 s (ref 24–37)

## 2014-05-21 MED ORDER — FENTANYL CITRATE 0.05 MG/ML IJ SOLN
INTRAMUSCULAR | Status: AC
  Filled 2014-05-21: qty 6

## 2014-05-21 MED ORDER — HYDROCODONE-ACETAMINOPHEN 5-325 MG PO TABS: 1.0000 | ORAL_TABLET | ORAL | Status: DC | PRN

## 2014-05-21 MED ORDER — MIDAZOLAM HCL 2 MG/2ML IJ SOLN
INTRAMUSCULAR | Status: AC | PRN
  Administered 2014-05-21: 2 mg via INTRAVENOUS
  Administered 2014-05-21 (×2): 1 mg via INTRAVENOUS

## 2014-05-21 MED ORDER — SODIUM CHLORIDE 0.9 % IV SOLN
Freq: Once | INTRAVENOUS | Status: AC
  Administered 2014-05-21: 08:00:00 via INTRAVENOUS

## 2014-05-21 MED ORDER — FENTANYL CITRATE 0.05 MG/ML IJ SOLN
INTRAMUSCULAR | Status: AC | PRN
  Administered 2014-05-21: 50 ug via INTRAVENOUS

## 2014-05-21 MED ORDER — MIDAZOLAM HCL 2 MG/2ML IJ SOLN
INTRAMUSCULAR | Status: AC
  Filled 2014-05-21: qty 6

## 2014-05-21 NOTE — Discharge Instructions (Signed)
Needle Biopsy °Care After °These instructions give you information on caring for yourself after your procedure. Your doctor may also give you more specific instructions. Call your doctor if you have any problems or questions after your procedure. °HOME CARE °· Rest for 4 hours after your biopsy, except for getting up to go to the bathroom or as told. °· Keep the places where the needles were put in clean and dry. °· Do not put powder or lotion on the sites. °· Do not shower until 24 hours after the test. Remove all bandages (dressings) before showering. °· Remove all bandages at least once every day. Gently clean the sites with soap and water. Keep putting a new bandage on until the skin is closed. °Finding out the results of your test °Ask your doctor when your test results will be ready. Make sure you follow up and get the test results. °GET HELP RIGHT AWAY IF:  °· You have shortness of breath or trouble breathing. °· You have pain or cramping in your belly (abdomen). °· You feel sick to your stomach (nauseous) or throw up (vomit). °· Any of the places where the needles were put in: °¨ Are puffy (swollen) or red. °¨ Are sore or hot to the touch. °¨ Are draining yellowish-white fluid (pus). °¨ Are bleeding after 10 minutes of pressing down on the site. Have someone keep pressing on any place that is bleeding until you see a doctor. °· You have any unusual pain that will not stop. °· You have a fever. °If you go to the emergency room, tell the nurse that you had a biopsy. Take this paper with you to show the nurse. °MAKE SURE YOU:  °· Understand these instructions. °· Will watch your condition. °· Will get help right away if you are not doing well or get worse. °Document Released: 06/11/2008 Document Revised: 09/21/2011 Document Reviewed: 06/11/2008 °[employer]® Patient Information ©2015 [employer]. This information is not intended to replace advice given to you by your health care provider. Make sure you discuss  any questions you have with your health care provider. °Conscious Sedation, Adult, Care After °Refer to this sheet in the next few weeks. These instructions provide you with information on caring for yourself after your procedure. Your health care provider may also give you more specific instructions. Your treatment has been planned according to current medical practices, but problems sometimes occur. Call your health care provider if you have any problems or questions after your procedure. °WHAT TO EXPECT AFTER THE PROCEDURE  °After your procedure: °· You may feel sleepy, clumsy, and have poor balance for several hours. °· Vomiting may occur if you eat too soon after the procedure. °HOME CARE INSTRUCTIONS °· Do not participate in any activities where you could become injured for at least 24 hours. Do not: °¨ Drive. °¨ Swim. °¨ Ride a bicycle. °¨ Operate heavy machinery. °¨ Cook. °¨ Use power tools. °¨ Climb ladders. °¨ Work from a high place. °· Do not make important decisions or sign legal documents until you are improved. °· If you vomit, drink water, juice, or soup when you can drink without vomiting. Make sure you have little or no nausea before eating solid foods. °· Only take over-the-counter or prescription medicines for pain, discomfort, or fever as directed by your health care provider. °· Make sure you and your family fully understand everything about the medicines given to you, including what side effects may occur. °· You should not drink alcohol,   take sleeping pills, or take medicines that cause drowsiness for at least 24 hours. °· If you smoke, do not smoke without supervision. °· If you are feeling better, you may resume normal activities 24 hours after you were sedated. °· Keep all appointments with your health care provider. °SEEK MEDICAL CARE IF: °· Your skin is pale or bluish in color. °· You continue to feel nauseous or vomit. °· Your pain is getting worse and is not helped by medicine. °· You  have bleeding or swelling. °· You are still sleepy or feeling clumsy after 24 hours. °SEEK IMMEDIATE MEDICAL CARE IF: °· You develop a rash. °· You have difficulty breathing. °· You develop any type of allergic problem. °· You have a fever. °MAKE SURE YOU: °· Understand these instructions. °· Will watch your condition. °· Will get help right away if you are not doing well or get worse. °Document Released: 04/19/2013 Document Reviewed: 04/19/2013 °[employer]® Patient Information ©2015 [employer]. This information is not intended to replace advice given to you by your health care provider. Make sure you discuss any questions you have with your health care provider. ° °

## 2014-05-21 NOTE — H&P (Signed)
Chief Complaint: "I'm having a biopsy"  Referring Physician(s): Shadad,Firas N  History of Present Illness: Kenneth Lawrence is a 63 y.o. male with history of TCC bladder and prior cystectomy/prostatectomy/ileal conduit formation. Recent PET scan reveals hypermetabolic peritoneal ST nodules/left pubic ramus lytic lesion worrisome for mets. He presents today for CT guided biopsy of a LLQ pelvic/peritoneal nodule.  Past Medical History  Diagnosis Date  . Hypertension   . Bladder mass   . Prostate enlargement   . Hyperlipidemia   . Headache(784.0)     hx. migraines  . LV dysfunction     resolved by 2012 stress test. EF 52%  . Erectile dysfunction   . Nonspecific abnormal unspecified cardiovascular function study     EF 52% in 10/12. Basal inferior wall defect. 7:45 on treadmill.  . Cardiomyopathy     Result  . Cancer     bladder cancer-hx. of past tumors  . Bladder cancer     25 yr history    Past Surgical History  Procedure Laterality Date  . Bladder tumor excision      Multiple times , none in 10 yrs  . Transurethral resection of bladder tumor N/A 03/16/2013    Procedure: TRANSURETHRAL RESECTION OF BLADDER TUMOR (TURBT);  Surgeon: Ailene Rud, MD;  Location: [facility];  Service: Urology;  Laterality: N/A;  . Robot assisted laparoscopic complete cystect ileal conduit N/A 08/11/2013    Procedure: ROBOTIC ASSISTED LAPAROSCOPIC COMPLETE CYSTECTOMY, PROSTATECTOMY,  ILEAL CONDUIT , ALSO CYSTOSCOPY, INDOCYANINE GREEN DYE INJECTION OF TUMOR;  Surgeon: Alexis Frock, MD;  Location: [facility];  Service: Urology;  Laterality: N/A;  . Lymphadenectomy Bilateral 08/11/2013    Procedure: LYMPHADENECTOMY;  Surgeon: Alexis Frock, MD;  Location: [facility];  Service: Urology;  Laterality: Bilateral;    Allergies: Review of patient's allergies indicates no known allergies.  Medications: Prior to Admission medications   Medication Sig Start Date End Date Taking? Authorizing Provider    amLODipine (NORVASC) 10 MG tablet Take 10 mg by mouth every morning.   Yes Historical Provider, MD  Ascorbic Acid (VITAMIN C PO) Take 1 tablet by mouth every morning.   Yes Historical Provider, MD  aspirin 81 MG chewable tablet Chew 81 mg by mouth every morning.   Yes Historical Provider, MD  carvedilol (COREG) 12.5 MG tablet Take 12.5 mg by mouth 2 (two) times daily with a meal.   Yes Historical Provider, MD  Cholecalciferol (VITAMIN D-3 PO) Take 1 tablet by mouth every morning.   Yes Historical Provider, MD  hydrochlorothiazide (HYDRODIURIL) 25 MG tablet Take 25 mg by mouth every morning.   Yes Historical Provider, MD  Omega-3 Fatty Acids (FISH OIL PO) Take 1 capsule by mouth 2 (two) times daily.   Yes Historical Provider, MD  potassium chloride SA (K-DUR,KLOR-CON) 20 MEQ tablet Take 20 mEq by mouth every morning.    Yes Historical Provider, MD  pravastatin (PRAVACHOL) 20 MG tablet Take 20 mg by mouth every morning.   Yes Historical Provider, MD  acetaminophen (TYLENOL) 500 MG tablet Take 500 mg by mouth every 6 (six) hours as needed (For back pain.).    Historical Provider, MD  lidocaine-prilocaine (EMLA) cream Apply 1 application topically daily as needed. port-cath    Historical Provider, MD  ondansetron (ZOFRAN) 8 MG tablet Take 8 mg by mouth every 8 (eight) hours as needed for nausea or vomiting.    Historical Provider, MD  oxyCODONE-acetaminophen (PERCOCET/ROXICET) 5-325 MG per tablet Take 1-2 tablets by  mouth every 6 (six) hours as needed for moderate pain. Post-operatively. 08/17/13   Alexis Frock, MD  PRESCRIPTION MEDICATION Gemcitabine HCl (GEMZAR) 1,938 mg in sodium chloride 0.9 % 100 mL chemo infusion 1,000 mg/m2  1.92 m2 (Treatment Plan Actual)  Once 06/16/2013    Historical Provider, MD  senna-docusate (SENOKOT-S) 8.6-50 MG per tablet Take 1 tablet by mouth 2 (two) times daily. While taking pain meds to prevent constipation Patient taking differently: Take 1 tablet by mouth 2 (two)  times daily as needed. For constipation. 08/17/13   Alexis Frock, MD    Family History  Problem Relation Age of Onset  . Cancer Mother     breast  . Cancer Father     brain  . Heart disease Father   . Stroke Father   . Prostate cancer Father   . Cancer Sister     breast in 1 sister    History   Social History  . Marital Status: Married    Spouse Name: N/A    Number of Children: N/A  . Years of Education: N/A   Social History Main Topics  . Smoking status: Former Smoker -- 0.50 packs/day    Types: Cigarettes    Quit date: 03/16/2013  . Smokeless tobacco: None     Comment: smoked, quit x 20 yrs, then quit 2 weeks ago  . Alcohol Use: Yes     Comment: occ to rare beer  . Drug Use: No  . Sexual Activity: Yes   Other Topics Concern  . None   Social History Narrative        Review of Systems  Constitutional: Negative for fever and chills.  Respiratory: Negative for cough and shortness of breath.   Cardiovascular: Negative for chest pain.  Gastrointestinal: Negative for nausea, vomiting, diarrhea, constipation and blood in stool.       Occ abd pain  Genitourinary: Negative for dysuria and hematuria.  Musculoskeletal:       Occ back pain  Neurological: Negative for headaches.  Hematological: Does not bruise/bleed easily.    Vital Signs: BP 136/67 mmHg  Pulse 73  Temp(Src) 98.3 F (36.8 C) (Oral)  Resp 16  Ht 5\' 9"  (1.753 m)  Wt 157 lb (71.215 kg)  BMI 23.17 kg/m2  SpO2 100%  Physical Exam  Constitutional: He is oriented to person, place, and time. He appears well-developed and well-nourished.  Cardiovascular: Normal rate and regular rhythm.   Pulmonary/Chest: Effort normal and breath sounds normal.  Intact rt chest wall PAC  Abdominal: Soft. Bowel sounds are normal.  Intact RLQ urostomy  Musculoskeletal: Normal range of motion. He exhibits no edema.  Neurological: He is alert and oriented to person, place, and time.    Imaging: Nm Pet Image  Initial (pi) Skull Base To Thigh  05/01/2014   CLINICAL DATA:  Initial treatment strategy for right lower lobe pulmonary nodules. History of bladder cancer.  EXAM: NUCLEAR MEDICINE PET SKULL BASE TO THIGH  TECHNIQUE: 7.7 mCi F-18 FDG was injected intravenously. Full-ring PET imaging was performed from the skull base to thigh after the radiotracer. CT data was obtained and used for attenuation correction and anatomic localization.  FASTING BLOOD GLUCOSE:  Value: 111 mg/dl  COMPARISON:  Chest abdomen and pelvic CTs ranging from 07/11/2013 through 03/28/2014.  FINDINGS: NECK  No hypermetabolic cervical lymph nodes are identified.There are no lesions of the pharyngeal mucosal space. Slightly asymmetric activity associated with the muscles of pronation is likely physiologic. There is no corresponding  abnormality on the CT images.  CHEST  There are no hypermetabolic mediastinal, hilar or axillary lymph nodes. There is low-level activity within the previously demonstrated sub solid left upper lobe lesion. However, this lesion has decreased in size, now measuring 1.5 x 0.8 cm on image 35. SUV max is 1.6. There is no abnormal metabolic activity within the subpleural nodule laterally at the right costophrenic angle. This measures approximately 1.3 cm on image 48 and is grossly unchanged. There are no new or enlarging pulmonary nodules.  ABDOMEN/PELVIS  There is no hypermetabolic activity within the liver, adrenal glands, spleen or pancreas. No hypermetabolic lymph nodes are identified within the abdomen. However, there are several abnormal areas of hypermetabolic activity within the pelvic peritoneum which appear separate from the urinary diversion. Posterior to the proximal sigmoid colon is a 1.4 cm soft tissue nodule on image 160 which demonstrates an SUV max of 6.0. Medial to the right external iliac vessels is a 1.5 cm soft tissue nodule on image 171 which has an SUV max of 7.7. There is hypermetabolic activity inferior  medial to the right piriformis muscle, possibly associated with surgical clips. This has an SUV max of 8.2. Within the cystoprostatectomy bed, there is abnormal soft tissue which demonstrates increased metabolic activity and an SUV max of 12.1.  SKELETON  There is a mixed sclerotic and lytic lesion involving the left inferior pubic ramus which is hypermetabolic and worrisome for metastatic disease. This demonstrates stenosis the max of 13.2. No other hypermetabolic osseous lesions demonstrated.  IMPRESSION: 1. There are several hypermetabolic peritoneal soft tissue nodules in the pelvis which are worrisome for metastatic bladder cancer. The intensity is greater than expected for postsurgical change, and cannot be explained by the prior urinary diversion. The components within the cystoprostatectomy bed are likely amenable to transrectal ultrasound-guided biopsy. 2. Hypermetabolic mixed lytic and sclerotic lesion within the left inferior pubic ramus worrisome for metastatic disease. 3. The pulmonary findings are unlikely to reflect metastatic disease. The nodule at the right costophrenic angle does not show any increased metabolic activity. The sub solid lesion in the left upper lobe is mildly hypermetabolic, although has partially contracted compared with the CT performed 5 weeks ago and is probably a resolving postinflammatory focus.   Electronically Signed   By: Camie Patience M.D.   On: 05/01/2014 11:30    Labs:  CBC:  Recent Labs  09/15/13 0924 11/14/13 1046 03/28/14 1018 05/21/14 0730  WBC 6.2 6.1 5.7 9.4  HGB 10.8* 9.9* 10.3* 11.3*  HCT 33.2* 31.4* 32.1* 34.8*  PLT 229 179 180 314    COAGS:  Recent Labs  05/21/14 0730  APTT 31    BMP:  Recent Labs  08/12/13 0658 08/13/13 0403 08/14/13 0451 08/15/13 0445 09/15/13 0924 11/14/13 1046 03/28/14 1019  NA 140 141 142 142 147* 143 141  K 4.1 3.9 3.7 3.6* 3.4* 4.0 3.5  CL 103 105 104 104  --   --   --   CO2 27 26 27 26  31* 27 26    GLUCOSE 184* 143* 115* 121* 115 124 112  BUN 19 16 14 10  11.8 23.0 23.7  CALCIUM 8.3* 8.0* 8.2* 8.1* 9.7 9.5 9.0  CREATININE 2.04* 1.96* 1.72* 1.42* 1.5* 1.7* 1.5*  GFRNONAA 33* 34* 40* 51*  --   --   --   GFRAA 38* 40* 47* 59*  --   --   --     LIVER FUNCTION TESTS:  Recent Labs  08/10/13  2040 09/15/13 0924 11/14/13 1046 03/28/14 1019  BILITOT 0.8 0.46 0.63 0.72  AST 13 12 10 8   ALT 9 7 <6 <6  ALKPHOS 67 83 75 76  PROT 7.1 7.2 6.9 6.9  ALBUMIN 3.8 3.2* 3.3* 3.4*    TUMOR MARKERS: No results for input(s): AFPTM, CEA, CA199, CHROMGRNA in the last 8760 hours.  Assessment and Plan: Kenneth Lawrence is a 63 y.o. male with history of TCC bladder and prior cystectomy/prostatectomy/ileal conduit formation. Recent PET scan reveals hypermetabolic peritoneal ST nodules/left pubic ramus lytic lesion worrisome for mets. He presents today for CT guided biopsy of a LLQ pelvic/peritoneal nodule. Details/risks of procedure d/w pt/wife with their understanding and consent.   SignedMarland Kitchen Autumn Messing 05/21/2014, 8:28 AM   I saw and evaluated Mr. Kehoe and agree with the PA note above.  Proceed as planned.  Signed,  Criselda Peaches, MD

## 2014-05-21 NOTE — Procedures (Signed)
Interventional Radiology Procedure Note  Procedure:  CT guided biopsy of LLQ retroperitoneal nodule Complications:  None immediate Recommendations:  - Bedrest x 1 hr - Path is pending  Signed,  Criselda Peaches, MD

## 2014-06-08 ENCOUNTER — Ambulatory Visit (HOSPITAL_BASED_OUTPATIENT_CLINIC_OR_DEPARTMENT_OTHER): Payer: Commercial Managed Care - PPO | Admitting: Oncology

## 2014-06-08 VITALS — BP 121/66 | HR 69 | Temp 97.8°F | Resp 18 | Ht 69.0 in | Wt 153.9 lb

## 2014-06-08 DIAGNOSIS — C786 Secondary malignant neoplasm of retroperitoneum and peritoneum: Secondary | ICD-10-CM

## 2014-06-08 DIAGNOSIS — C679 Malignant neoplasm of bladder, unspecified: Secondary | ICD-10-CM

## 2014-06-08 DIAGNOSIS — C672 Malignant neoplasm of lateral wall of bladder: Secondary | ICD-10-CM

## 2014-06-08 NOTE — Progress Notes (Signed)
Hematology and Oncology Follow Up Visit  Kenneth Lawrence [id number] 03/19/1951 63 y.o. 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 He has a 5.1 cm of the right posterior bladder wall and invasion into the perivesicularwith clinical staging T4 N0 disease. Now he has stage IV disease.   Prior Therapy: On 03/16/2013 he underwent cystourethroscopy and the resection of a 10 cm right lateral bladder wall tumor. He is S/P neoadjuvant chemotherapy with Cisplatin and Gemzar  started on on 04/28/13 and was completed 06/16/2013. He is status post robotic-assisted laparoscopic complete cystectomy, prostatectomy, and ileal conduit formation with lymphadenectomy. This was done on 08/11/2013.  Current therapy: Under evaluation for systemic therapy.  Interim History:  Kenneth Lawrence presents today for a followup visit. Since his last visit, he underwent a biopsy and tolerated it well at this time. He continues to complain of constipation but no other symptoms. He has not reported any pelvic pain or discomfort. He is not reporting any urination problems at this time. Has not reported any hematuria or dysuria. Has not reported any peripheral neuropathy or hearing difficulties. He is gaining weight quite nicely and have regained most activities of daily living. He has not reported any headaches or blurry vision or double vision. Has not reported any major changes in his performance status or activity level. Has not reported any abdominal pain or flank pain. Has not reported any hematochezia or melena. Has not reported any changes in his urination. Has not reported any lymphadenopathy or skin changes. He has not reported any respiratory symptoms although he continues to smoke. He does not report any cough or hemoptysis. Has not reported any recent infections. The rest of the review of systems  unremarkable.  Medications: I have reviewed the patient's current medications.  Current Outpatient Prescriptions  Medication Sig Dispense Refill  . acetaminophen (TYLENOL) 500 MG tablet Take 500 mg by mouth every 6 (six) hours as needed (For back pain.).    Marland Kitchen amLODipine (NORVASC) 10 MG tablet Take 10 mg by mouth every morning.    . Ascorbic Acid (VITAMIN C PO) Take 1 tablet by mouth every morning.    Marland Kitchen aspirin 81 MG chewable tablet Chew 81 mg by mouth every morning.    . carvedilol (COREG) 12.5 MG tablet Take 12.5 mg by mouth 2 (two) times daily with a meal.    . Cholecalciferol (VITAMIN D-3 PO) Take 1 tablet by mouth every morning.    . hydrochlorothiazide (HYDRODIURIL) 25 MG tablet Take 25 mg by mouth every morning.    . lidocaine-prilocaine (EMLA) cream Apply 1 application topically daily as needed. port-cath    . Omega-3 Fatty Acids (FISH OIL PO) Take 1 capsule by mouth 2 (two) times daily.    . ondansetron (ZOFRAN) 8 MG tablet Take 8 mg by mouth every 8 (eight) hours as needed for nausea or vomiting.    Marland Kitchen oxyCODONE-acetaminophen (PERCOCET/ROXICET) 5-325 MG per tablet Take 1-2 tablets by mouth every 6 (six) hours as needed for moderate pain. Post-operatively. 40 tablet 0  . potassium chloride SA (K-DUR,KLOR-CON) 20 MEQ tablet Take 20 mEq by mouth every morning.     . pravastatin (PRAVACHOL) 20 MG tablet Take 20 mg by mouth every morning.    Marland Kitchen PRESCRIPTION MEDICATION Gemcitabine HCl (GEMZAR) 1,938 mg in sodium chloride 0.9 % 100 mL chemo infusion 1,000 mg/m2  1.92 m2 (Treatment Plan Actual)  Once 06/16/2013    .  senna-docusate (SENOKOT-S) 8.6-50 MG per tablet Take 1 tablet by mouth 2 (two) times daily. While taking pain meds to prevent constipation (Patient taking differently: Take 1 tablet by mouth 2 (two) times daily as needed. For constipation.) 20 tablet 1   No current facility-administered medications for this visit.     Allergies: No Known Allergies  Past Medical History, Surgical  history, Social history, and Family History were reviewed and updated.    Physical Exam: Blood pressure 121/66, pulse 69, temperature 97.8 F (36.6 C), temperature source Oral, resp. rate 18, height 5\' 9"  (1.753 m), weight 153 lb 14.4 oz (69.809 kg). ECOG: 1 General appearance: alert and cooperative Head: Normocephalic, without obvious abnormality, atraumatic Neck: no adenopathy Lymph nodes: Cervical, supraclavicular, and axillary nodes normal. Heart:regular rate and rhythm, S1, S2 normal, no murmur, click, rub or gallop Lung:chest clear, no wheezing, rales, normal symmetric air entry Abdomen: soft, non-tender, without masses or organomegaly EXT:no erythema, induration, or nodules   Lab Results: Lab Results  Component Value Date   WBC 9.4 05/21/2014   HGB 11.3* 05/21/2014   HCT 34.8* 05/21/2014   MCV 79.8 05/21/2014   PLT 314 05/21/2014     Chemistry      Component Value Date/Time   NA 141 03/28/2014 1019   NA 142 08/15/2013 0445   K 3.5 03/28/2014 1019   K 3.6* 08/15/2013 0445   CL 104 08/15/2013 0445   CO2 26 03/28/2014 1019   CO2 26 08/15/2013 0445   BUN 23.7 03/28/2014 1019   BUN 10 08/15/2013 0445   CREATININE 1.5* 03/28/2014 1019   CREATININE 1.42* 08/15/2013 0445      Component Value Date/Time   CALCIUM 9.0 03/28/2014 1019   CALCIUM 8.1* 08/15/2013 0445   ALKPHOS 76 03/28/2014 1019   ALKPHOS 67 08/10/2013 2040   AST 8 03/28/2014 1019   AST 13 08/10/2013 2040   ALT <6 03/28/2014 1019   ALT 9 08/10/2013 2040   BILITOT 0.72 03/28/2014 1019   BILITOT 0.8 08/10/2013 2040       IMPRESSION:  1. There are several hypermetabolic peritoneal soft tissue nodules  in the pelvis which are worrisome for metastatic bladder cancer. The  intensity is greater than expected for postsurgical change, and  cannot be explained by the prior urinary diversion. The components  within the cystoprostatectomy bed are likely amenable to transrectal  ultrasound-guided biopsy.   2. Hypermetabolic mixed lytic and sclerotic lesion within the left  inferior pubic ramus worrisome for metastatic disease.  3. The pulmonary findings are unlikely to reflect metastatic  disease. The nodule at the right costophrenic angle does not show  any increased metabolic activity. The sub solid lesion in the left  upper lobe is mildly hypermetabolic, although has partially  contracted compared with the CT performed 5 weeks ago and is  probably a resolving postinflammatory focus.     Impression and Plan:   63 year old gentleman with the following issues:  1. 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 He received neoadjuvant chemotherapy with cisplatin and Gemzar and underwent a surgical resection on 08/11/2013. His final pathology revealed a T3 N0 disease without any evidence of lymph node involvement on his lymphadenectomy. The margins were negative at this time. PET CT scan obtained on 05/01/2014 suggested metastatic disease. Biopsy results obtain on 05/21/2014 was discussed and confirmed the presence of transitional cell carcinoma.  Options of treatments were discussed again including surgical resection, radiation therapy versus systemic  chemotherapy. I feel the only option to palliate his disease at this point is systemic chemotherapy. The logistics of administration of Taxotere chemotherapy was discussed. Complications include nausea, vomiting, myelosuppression, neutropenia, neutropenic sepsis was discussed. The goal would be palliation and delay of progression and less likely cure or remission.   Alternatives to chemotherapy would include supportive care only which will ensure symptom management the likely progression of disease rather rapidly.  He will think about these options at this time and let me know in the near future.   2. IV access: PAC is in place this will continue to be flushed on an intermittent basis.  3.  Acute renal failure: This has resolved. His creatinine close to baseline.  4. Follow-up: Depending on his decision whether to pursue chemotherapy or not.  [facility] 11/27/201512:32 PM

## 2014-06-12 ENCOUNTER — Telehealth: Payer: Self-pay | Admitting: *Deleted

## 2014-06-12 ENCOUNTER — Telehealth: Payer: Self-pay | Admitting: Oncology

## 2014-06-12 NOTE — Telephone Encounter (Signed)
Per staff message and POF I have scheduled appts. Advised scheduler of appts. JMW  

## 2014-06-12 NOTE — Telephone Encounter (Signed)
per pof to sch pt appt-cld & spoke to pt to adv of times & dates-pt understood

## 2014-06-12 NOTE — Addendum Note (Signed)
Addended by: Wyatt Portela on: 06/12/2014 07:37 AM   Modules accepted: Orders

## 2014-06-12 NOTE — Telephone Encounter (Signed)
pt after reply-per Dixie ok to sch inj on 07/14/14

## 2014-06-12 NOTE — Telephone Encounter (Signed)
Patient and family calling to say they would like to resume chemotherapy treatments, per dr Alen Blew, pof to schedulers for appts. Family aware schedulers will be calling with appts.

## 2014-06-15 ENCOUNTER — Telehealth: Payer: Self-pay | Admitting: Medical Oncology

## 2014-06-15 ENCOUNTER — Other Ambulatory Visit: Payer: Self-pay | Admitting: Medical Oncology

## 2014-06-15 ENCOUNTER — Ambulatory Visit (HOSPITAL_BASED_OUTPATIENT_CLINIC_OR_DEPARTMENT_OTHER): Payer: Commercial Managed Care - PPO

## 2014-06-15 ENCOUNTER — Other Ambulatory Visit (HOSPITAL_BASED_OUTPATIENT_CLINIC_OR_DEPARTMENT_OTHER): Payer: Commercial Managed Care - PPO

## 2014-06-15 DIAGNOSIS — C679 Malignant neoplasm of bladder, unspecified: Secondary | ICD-10-CM

## 2014-06-15 DIAGNOSIS — Z5111 Encounter for antineoplastic chemotherapy: Secondary | ICD-10-CM

## 2014-06-15 LAB — COMPREHENSIVE METABOLIC PANEL (CC13)
ALT: 14 U/L (ref 0–55)
AST: 14 U/L (ref 5–34)
Albumin: 3.5 g/dL (ref 3.5–5.0)
Alkaline Phosphatase: 74 U/L (ref 40–150)
Anion Gap: 11 mEq/L (ref 3–11)
BUN: 22.5 mg/dL (ref 7.0–26.0)
CO2: 28 mEq/L (ref 22–29)
Calcium: 9.6 mg/dL (ref 8.4–10.4)
Chloride: 103 mEq/L (ref 98–109)
Creatinine: 1.6 mg/dL — ABNORMAL HIGH (ref 0.7–1.3)
EGFR: 53 mL/min/{1.73_m2} — ABNORMAL LOW (ref >90)
Glucose: 172 mg/dl — ABNORMAL HIGH (ref 70–140)
Potassium: 4.1 mEq/L (ref 3.5–5.1)
Sodium: 142 mEq/L (ref 136–145)
Total Bilirubin: 0.53 mg/dL (ref 0.20–1.20)
Total Protein: 6.9 g/dL (ref 6.4–8.3)

## 2014-06-15 LAB — CBC WITH DIFFERENTIAL/PLATELET
BASO%: 0.3 % (ref 0.0–2.0)
BASOS ABS: 0 10*3/uL (ref 0.0–0.1)
EOS%: 4.8 % (ref 0.0–7.0)
Eosinophils Absolute: 0.3 10*3/uL (ref 0.0–0.5)
HCT: 35.2 % — ABNORMAL LOW (ref 38.4–49.9)
HEMOGLOBIN: 11.3 g/dL — AB (ref 13.0–17.1)
LYMPH#: 1.6 10*3/uL (ref 0.9–3.3)
LYMPH%: 25.5 % (ref 14.0–49.0)
MCH: 25.7 pg — ABNORMAL LOW (ref 27.2–33.4)
MCHC: 32.1 g/dL (ref 32.0–36.0)
MCV: 80.2 fL (ref 79.3–98.0)
MONO#: 0.5 10*3/uL (ref 0.1–0.9)
MONO%: 7.4 % (ref 0.0–14.0)
NEUT#: 3.8 10*3/uL (ref 1.5–6.5)
NEUT%: 62 % (ref 39.0–75.0)
Platelets: 185 10*3/uL (ref 140–400)
RBC: 4.39 10*6/uL (ref 4.20–5.82)
RDW: 15.1 % — AB (ref 11.0–14.6)
WBC: 6.1 10*3/uL (ref 4.0–10.3)
nRBC: 0 % (ref 0–0)

## 2014-06-15 MED ORDER — SODIUM CHLORIDE 0.9 % IV SOLN
Freq: Once | INTRAVENOUS | Status: AC
  Administered 2014-06-15: 10:00:00 via INTRAVENOUS

## 2014-06-15 MED ORDER — DOCETAXEL CHEMO INJECTION 160 MG/16ML
75.0000 mg/m2 | Freq: Once | INTRAVENOUS | Status: AC
  Administered 2014-06-15: 140 mg via INTRAVENOUS
  Filled 2014-06-15: qty 14

## 2014-06-15 MED ORDER — DEXAMETHASONE SODIUM PHOSPHATE 10 MG/ML IJ SOLN
10.0000 mg | Freq: Once | INTRAMUSCULAR | Status: AC
  Administered 2014-06-15: 10 mg via INTRAVENOUS

## 2014-06-15 MED ORDER — ONDANSETRON HCL 8 MG PO TABS: 8.0000 mg | ORAL_TABLET | Freq: Three times a day (TID) | ORAL | Status: AC | PRN

## 2014-06-15 MED ORDER — SODIUM CHLORIDE 0.9 % IJ SOLN
10.0000 mL | INTRAMUSCULAR | Status: DC | PRN
  Administered 2014-06-15: 10 mL
  Filled 2014-06-15: qty 10

## 2014-06-15 MED ORDER — HEPARIN SOD (PORK) LOCK FLUSH 100 UNIT/ML IV SOLN
500.0000 [IU] | Freq: Once | INTRAVENOUS | Status: AC | PRN
  Administered 2014-06-15: 500 [IU]
  Filled 2014-06-15: qty 5

## 2014-06-15 MED ORDER — ONDANSETRON 8 MG/50ML IVPB (CHCC)
8.0000 mg | Freq: Once | INTRAVENOUS | Status: AC
  Administered 2014-06-15: 8 mg via INTRAVENOUS

## 2014-06-15 NOTE — Patient Instructions (Signed)
[place] Discharge Instructions for Patients Receiving Chemotherapy  Today you received the following chemotherapy agents Taxotere  To help prevent nausea and vomiting after your treatment, we encourage you to take your nausea medication Zofran as directed   If you develop nausea and vomiting that is not controlled by your nausea medication, call the clinic.   BELOW ARE SYMPTOMS THAT SHOULD BE REPORTED IMMEDIATELY:  *FEVER GREATER THAN 100.5 F  *CHILLS WITH OR WITHOUT FEVER  NAUSEA AND VOMITING THAT IS NOT CONTROLLED WITH YOUR NAUSEA MEDICATION  *UNUSUAL SHORTNESS OF BREATH  *UNUSUAL BRUISING OR BLEEDING  TENDERNESS IN MOUTH AND THROAT WITH OR WITHOUT PRESENCE OF ULCERS  *URINARY PROBLEMS  *BOWEL PROBLEMS  UNUSUAL RASH Items with * indicate a potential emergency and should be followed up as soon as possible.  Feel free to call the clinic you have any questions or concerns. The clinic phone number is [phone].

## 2014-06-15 NOTE — Telephone Encounter (Signed)
Message left to call

## 2014-06-16 ENCOUNTER — Ambulatory Visit (HOSPITAL_BASED_OUTPATIENT_CLINIC_OR_DEPARTMENT_OTHER): Payer: Commercial Managed Care - PPO

## 2014-06-16 DIAGNOSIS — Z5189 Encounter for other specified aftercare: Secondary | ICD-10-CM

## 2014-06-16 DIAGNOSIS — C679 Malignant neoplasm of bladder, unspecified: Secondary | ICD-10-CM

## 2014-06-16 MED ORDER — PEGFILGRASTIM INJECTION 6 MG/0.6ML ~~LOC~~
6.0000 mg | PREFILLED_SYRINGE | Freq: Once | SUBCUTANEOUS | Status: AC
  Administered 2014-06-16: 6 mg via SUBCUTANEOUS

## 2014-07-12 ENCOUNTER — Telehealth: Payer: Self-pay | Admitting: Oncology

## 2014-07-12 ENCOUNTER — Ambulatory Visit (HOSPITAL_BASED_OUTPATIENT_CLINIC_OR_DEPARTMENT_OTHER): Payer: Commercial Managed Care - PPO

## 2014-07-12 ENCOUNTER — Other Ambulatory Visit (HOSPITAL_BASED_OUTPATIENT_CLINIC_OR_DEPARTMENT_OTHER): Payer: Commercial Managed Care - PPO

## 2014-07-12 ENCOUNTER — Ambulatory Visit (HOSPITAL_BASED_OUTPATIENT_CLINIC_OR_DEPARTMENT_OTHER): Payer: Commercial Managed Care - PPO | Admitting: Oncology

## 2014-07-12 VITALS — BP 131/64 | HR 88 | Temp 97.9°F | Resp 18 | Ht 69.0 in | Wt 154.9 lb

## 2014-07-12 DIAGNOSIS — Z5111 Encounter for antineoplastic chemotherapy: Secondary | ICD-10-CM

## 2014-07-12 DIAGNOSIS — C674 Malignant neoplasm of posterior wall of bladder: Secondary | ICD-10-CM

## 2014-07-12 DIAGNOSIS — N179 Acute kidney failure, unspecified: Secondary | ICD-10-CM

## 2014-07-12 DIAGNOSIS — C679 Malignant neoplasm of bladder, unspecified: Secondary | ICD-10-CM

## 2014-07-12 LAB — COMPREHENSIVE METABOLIC PANEL (CC13)
ALBUMIN: 3.6 g/dL (ref 3.5–5.0)
ALT: 8 U/L (ref 0–55)
AST: 13 U/L (ref 5–34)
Alkaline Phosphatase: 78 U/L (ref 40–150)
Anion Gap: 12 mEq/L — ABNORMAL HIGH (ref 3–11)
BUN: 18.7 mg/dL (ref 7.0–26.0)
CO2: 26 mEq/L (ref 22–29)
CREATININE: 1.5 mg/dL — AB (ref 0.7–1.3)
Calcium: 9.3 mg/dL (ref 8.4–10.4)
Chloride: 105 mEq/L (ref 98–109)
EGFR: 56 mL/min/{1.73_m2} — ABNORMAL LOW (ref >90)
GLUCOSE: 143 mg/dL — AB (ref 70–140)
Potassium: 3.5 mEq/L (ref 3.5–5.1)
Sodium: 143 mEq/L (ref 136–145)
Total Bilirubin: 0.79 mg/dL (ref 0.20–1.20)
Total Protein: 7.1 g/dL (ref 6.4–8.3)

## 2014-07-12 LAB — CBC WITH DIFFERENTIAL/PLATELET
BASO%: 0.5 % (ref 0.0–2.0)
BASOS ABS: 0.1 10*3/uL (ref 0.0–0.1)
EOS%: 6.4 % (ref 0.0–7.0)
Eosinophils Absolute: 0.7 10*3/uL — ABNORMAL HIGH (ref 0.0–0.5)
HCT: 34.3 % — ABNORMAL LOW (ref 38.4–49.9)
HEMOGLOBIN: 10.8 g/dL — AB (ref 13.0–17.1)
LYMPH%: 18.6 % (ref 14.0–49.0)
MCH: 25.4 pg — ABNORMAL LOW (ref 27.2–33.4)
MCHC: 31.5 g/dL — ABNORMAL LOW (ref 32.0–36.0)
MCV: 80.5 fL (ref 79.3–98.0)
MONO#: 0.8 10*3/uL (ref 0.1–0.9)
MONO%: 7.7 % (ref 0.0–14.0)
NEUT%: 66.8 % (ref 39.0–75.0)
NEUTROS ABS: 7.1 10*3/uL — AB (ref 1.5–6.5)
PLATELETS: 248 10*3/uL (ref 140–400)
RBC: 4.26 10*6/uL (ref 4.20–5.82)
RDW: 15.9 % — AB (ref 11.0–14.6)
WBC: 10.5 10*3/uL — ABNORMAL HIGH (ref 4.0–10.3)
lymph#: 2 10*3/uL (ref 0.9–3.3)

## 2014-07-12 MED ORDER — ONDANSETRON 8 MG/50ML IVPB (CHCC)
8.0000 mg | Freq: Once | INTRAVENOUS | Status: AC
  Administered 2014-07-12: 8 mg via INTRAVENOUS

## 2014-07-12 MED ORDER — DEXTROSE 5 % IV SOLN
75.0000 mg/m2 | Freq: Once | INTRAVENOUS | Status: AC
  Administered 2014-07-12: 140 mg via INTRAVENOUS
  Filled 2014-07-12: qty 14

## 2014-07-12 MED ORDER — SODIUM CHLORIDE 0.9 % IJ SOLN
10.0000 mL | INTRAMUSCULAR | Status: DC | PRN
  Administered 2014-07-12: 10 mL
  Filled 2014-07-12: qty 10

## 2014-07-12 MED ORDER — HEPARIN SOD (PORK) LOCK FLUSH 100 UNIT/ML IV SOLN
500.0000 [IU] | Freq: Once | INTRAVENOUS | Status: AC | PRN
  Administered 2014-07-12: 500 [IU]
  Filled 2014-07-12: qty 5

## 2014-07-12 MED ORDER — ONDANSETRON 8 MG/NS 50 ML IVPB
INTRAVENOUS | Status: AC
  Filled 2014-07-12: qty 8

## 2014-07-12 MED ORDER — DEXAMETHASONE SODIUM PHOSPHATE 10 MG/ML IJ SOLN
10.0000 mg | Freq: Once | INTRAMUSCULAR | Status: AC
  Administered 2014-07-12: 10 mg via INTRAVENOUS

## 2014-07-12 MED ORDER — SODIUM CHLORIDE 0.9 % IV SOLN
Freq: Once | INTRAVENOUS | Status: AC
  Administered 2014-07-12: 10:00:00 via INTRAVENOUS

## 2014-07-12 MED ORDER — DEXAMETHASONE SODIUM PHOSPHATE 10 MG/ML IJ SOLN
INTRAMUSCULAR | Status: AC
  Filled 2014-07-12: qty 1

## 2014-07-12 NOTE — Patient Instructions (Signed)
[facility] Discharge Instructions for Patients Receiving Chemotherapy  Today you received the following chemotherapy agents: Taxotere  To help prevent nausea and vomiting after your treatment, we encourage you to take your nausea medication as prescribed by your physician.    If you develop nausea and vomiting that is not controlled by your nausea medication, call the clinic.   BELOW ARE SYMPTOMS THAT SHOULD BE REPORTED IMMEDIATELY:  *FEVER GREATER THAN 100.5 F  *CHILLS WITH OR WITHOUT FEVER  NAUSEA AND VOMITING THAT IS NOT CONTROLLED WITH YOUR NAUSEA MEDICATION  *UNUSUAL SHORTNESS OF BREATH  *UNUSUAL BRUISING OR BLEEDING  TENDERNESS IN MOUTH AND THROAT WITH OR WITHOUT PRESENCE OF ULCERS  *URINARY PROBLEMS  *BOWEL PROBLEMS  UNUSUAL RASH Items with * indicate a potential emergency and should be followed up as soon as possible.  Feel free to call the clinic you have any questions or concerns. The clinic phone number is [phone].    

## 2014-07-12 NOTE — Telephone Encounter (Signed)
gv and prnted appt sched and avs for pt for Jan and Feb....sed added tx.Marland KitchenMarland Kitchenpt wanted Friday appt...done.Marland KitchenMarland Kitchen

## 2014-07-12 NOTE — Progress Notes (Signed)
Hematology and Oncology Follow Up Visit  Kenneth Lawrence [id number] 1951-05-26 63 y.o. 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 He has a 5.1 cm of the right posterior bladder wall and invasion into the perivesicularwith clinical staging T4 N0 disease. Now he has stage IV disease.   Prior Therapy: On 03/16/2013 he underwent cystourethroscopy and the resection of a 10 cm right lateral bladder wall tumor. He is S/P neoadjuvant chemotherapy with Cisplatin and Gemzar  started on on 04/28/13 and was completed 06/16/2013. He is status post robotic-assisted laparoscopic complete cystectomy, prostatectomy, and ileal conduit formation with lymphadenectomy. This was done on 08/11/2013.  Current therapy:  Taxotere chemotherapy started on 06/15/2014. He is here for cycle 2.  Interim History:  Kenneth Lawrence presents today for a followup visit. Since his last visit, he completed the first cycle of chemotherapy and tolerated it well at this time. He does not report any nausea or vomiting. He did not report any infusion-related complications. He did not report any peripheral neuropathy. He did report change in his appetite but his weight has been reasonably stable. He continues to complain of constipation but no other symptoms. He has not reported any pelvic pain or discomfort. He is not reporting any urination problems at this time. Has not reported any hematuria or dysuria. Has not reported any peripheral neuropathy or hearing difficulties. He is gaining weight quite nicely and have regained most activities of daily living. He has not reported any headaches or blurry vision or double vision. Has not reported any major changes in his performance status or activity level. Has not reported any abdominal pain or flank pain. Has not reported any hematochezia or melena. Has not reported any  changes in his urination. Has not reported any lymphadenopathy or skin changes. He has not reported any respiratory symptoms although he continues to smoke. He does not report any cough or hemoptysis. Has not reported any recent infections. The rest of the review of systems unremarkable.  Medications: I have reviewed the patient's current medications.  Current Outpatient Prescriptions  Medication Sig Dispense Refill  . acetaminophen (TYLENOL) 500 MG tablet Take 500 mg by mouth every 6 (six) hours as needed (For back pain.).    Marland Kitchen amLODipine (NORVASC) 10 MG tablet Take 10 mg by mouth every morning.    . Ascorbic Acid (VITAMIN C PO) Take 1 tablet by mouth every morning.    Marland Kitchen aspirin 81 MG chewable tablet Chew 81 mg by mouth every morning.    . carvedilol (COREG) 12.5 MG tablet Take 12.5 mg by mouth 2 (two) times daily with a meal.    . Cholecalciferol (VITAMIN D-3 PO) Take 1 tablet by mouth every morning.    . hydrochlorothiazide (HYDRODIURIL) 25 MG tablet Take 25 mg by mouth every morning.    . lidocaine-prilocaine (EMLA) cream Apply 1 application topically daily as needed. port-cath    . Omega-3 Fatty Acids (FISH OIL PO) Take 1 capsule by mouth 2 (two) times daily.    . ondansetron (ZOFRAN) 8 MG tablet Take 1 tablet (8 mg total) by mouth every 8 (eight) hours as needed for nausea or vomiting. 30 tablet 0  . oxyCODONE-acetaminophen (PERCOCET/ROXICET) 5-325 MG per tablet Take 1-2 tablets by mouth every 6 (six) hours as needed for moderate pain. Post-operatively. 40 tablet 0  . potassium chloride SA (K-DUR,KLOR-CON) 20 MEQ tablet Take 20 mEq by mouth every morning.     Marland Kitchen  pravastatin (PRAVACHOL) 20 MG tablet Take 20 mg by mouth every morning.    Marland Kitchen PRESCRIPTION MEDICATION Gemcitabine HCl (GEMZAR) 1,938 mg in sodium chloride 0.9 % 100 mL chemo infusion 1,000 mg/m2  1.92 m2 (Treatment Plan Actual)  Once 06/16/2013    . senna-docusate (SENOKOT-S) 8.6-50 MG per tablet Take 1 tablet by mouth 2 (two) times  daily. While taking pain meds to prevent constipation (Patient taking differently: Take 1 tablet by mouth 2 (two) times daily as needed. For constipation.) 20 tablet 1   No current facility-administered medications for this visit.     Allergies: No Known Allergies  Past Medical History, Surgical history, Social history, and Family History were reviewed and updated.    Physical Exam: Blood pressure 131/64, pulse 88, temperature 97.9 F (36.6 C), temperature source Oral, resp. rate 18, height 5\' 9"  (1.753 m), weight 154 lb 14.4 oz (70.262 kg), SpO2 97 %. ECOG: 1 General appearance: alert and cooperative Head: Normocephalic, without obvious abnormality, atraumatic Neck: no adenopathy Lymph nodes: Cervical, supraclavicular, and axillary nodes normal. Heart:regular rate and rhythm, S1, S2 normal, no murmur, click, rub or gallop Lung:chest clear, no wheezing, rales, normal symmetric air entry Abdomen: soft, non-tender, without masses or organomegaly EXT:no erythema, induration, or nodules Neurological examination: No deficits.  Lab Results: Lab Results  Component Value Date   WBC 10.5* 07/12/2014   HGB 10.8* 07/12/2014   HCT 34.3* 07/12/2014   MCV 80.5 07/12/2014   PLT 248 07/12/2014     Chemistry      Component Value Date/Time   NA 142 06/15/2014 1456   NA 142 08/15/2013 0445   K 4.1 06/15/2014 1456   K 3.6* 08/15/2013 0445   CL 104 08/15/2013 0445   CO2 28 06/15/2014 1456   CO2 26 08/15/2013 0445   BUN 22.5 06/15/2014 1456   BUN 10 08/15/2013 0445   CREATININE 1.6* 06/15/2014 1456   CREATININE 1.42* 08/15/2013 0445      Component Value Date/Time   CALCIUM 9.6 06/15/2014 1456   CALCIUM 8.1* 08/15/2013 0445   ALKPHOS 74 06/15/2014 1456   ALKPHOS 67 08/10/2013 2040   AST 14 06/15/2014 1456   AST 13 08/10/2013 2040   ALT 14 06/15/2014 1456   ALT 9 08/10/2013 2040   BILITOT 0.53 06/15/2014 1456   BILITOT 0.8 08/10/2013 2040        Impression and Plan:    63 year old gentleman with the following issues:  1. 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 He received neoadjuvant chemotherapy with cisplatin and Gemzar and underwent a surgical resection on 08/11/2013. His final pathology revealed a T3 N0 disease without any evidence of lymph node involvement on his lymphadenectomy. The margins were negative at this time. PET CT scan obtained on 05/01/2014 suggested metastatic disease. Biopsy results obtain on 05/21/2014 and confirmed the presence of transitional cell carcinoma.  He is currently on salvage Taxotere chemotherapy and tolerated the first cycle well. The plan is to proceed with cycle 2 without any dose reduction or delay. We will schedule a PET scan after the completion of cycle 3 to assess response.   2. IV access: PAC is in place this will continue to be flushed on an intermittent basis.  3. Acute renal failure: This has resolved. His creatinine close to baseline.  4. Neutropenia prophylaxis: He will receive Neulasta after each cycle of chemotherapy.  5. Follow-up: In 3 weeks for cycle 3.  [id number],VZDGL 12/31/20159:50 AM

## 2014-07-14 ENCOUNTER — Ambulatory Visit (HOSPITAL_BASED_OUTPATIENT_CLINIC_OR_DEPARTMENT_OTHER): Payer: Commercial Managed Care - PPO

## 2014-07-14 DIAGNOSIS — Z5189 Encounter for other specified aftercare: Secondary | ICD-10-CM

## 2014-07-14 DIAGNOSIS — C679 Malignant neoplasm of bladder, unspecified: Secondary | ICD-10-CM

## 2014-07-14 MED ORDER — PEGFILGRASTIM INJECTION 6 MG/0.6ML ~~LOC~~
6.0000 mg | PREFILLED_SYRINGE | Freq: Once | SUBCUTANEOUS | Status: AC
  Administered 2014-07-14: 6 mg via SUBCUTANEOUS

## 2014-07-14 NOTE — Patient Instructions (Signed)
Pegfilgrastim injection What is this medicine? PEGFILGRASTIM (peg fil GRA stim) is a long-acting granulocyte colony-stimulating factor that stimulates the growth of neutrophils, a type of white blood cell important in the body's fight against infection. It is used to reduce the incidence of fever and infection in patients with certain types of cancer who are receiving chemotherapy that affects the bone marrow. This medicine may be used for other purposes; ask your health care provider or pharmacist if you have questions. COMMON BRAND NAME(S): Neulasta What should I tell my health care provider before I take this medicine? They need to know if you have any of these conditions: -latex allergy -ongoing radiation therapy -sickle cell disease -skin reactions to acrylic adhesives (On-Body Injector only) -an unusual or allergic reaction to pegfilgrastim, filgrastim, other medicines, foods, dyes, or preservatives -pregnant or trying to get pregnant -breast-feeding How should I use this medicine? This medicine is for injection under the skin. If you get this medicine at home, you will be taught how to prepare and give the pre-filled syringe or how to use the On-body Injector. Refer to the patient Instructions for Use for detailed instructions. Use exactly as directed. Take your medicine at regular intervals. Do not take your medicine more often than directed. It is important that you put your used needles and syringes in a special sharps container. Do not put them in a trash can. If you do not have a sharps container, call your pharmacist or healthcare provider to get one. Talk to your pediatrician regarding the use of this medicine in children. Special care may be needed. Overdosage: If you think you have taken too much of this medicine contact a poison control center or emergency room at once. NOTE: This medicine is only for you. Do not share this medicine with others. What if I miss a dose? It is  important not to miss your dose. Call your doctor or health care professional if you miss your dose. If you miss a dose due to an On-body Injector failure or leakage, a new dose should be administered as soon as possible using a single prefilled syringe for manual use. What may interact with this medicine? Interactions have not been studied. Give your health care provider a list of all the medicines, herbs, non-prescription drugs, or dietary supplements you use. Also tell them if you smoke, drink alcohol, or use illegal drugs. Some items may interact with your medicine. This list may not describe all possible interactions. Give your health care provider a list of all the medicines, herbs, non-prescription drugs, or dietary supplements you use. Also tell them if you smoke, drink alcohol, or use illegal drugs. Some items may interact with your medicine. What should I watch for while using this medicine? You may need blood work done while you are taking this medicine. If you are going to need a MRI, CT scan, or other procedure, tell your doctor that you are using this medicine (On-Body Injector only). What side effects may I notice from receiving this medicine? Side effects that you should report to your doctor or health care professional as soon as possible: -allergic reactions like skin rash, itching or hives, swelling of the face, lips, or tongue -dizziness -fever -pain, redness, or irritation at site where injected -pinpoint red spots on the skin -shortness of breath or breathing problems -stomach or side pain, or pain at the shoulder -swelling -tiredness -trouble passing urine Side effects that usually do not require medical attention (report to your doctor   or health care professional if they continue or are bothersome): -bone pain -muscle pain This list may not describe all possible side effects. Call your doctor for medical advice about side effects. You may report side effects to FDA at  1-800-FDA-1088. Where should I keep my medicine? Keep out of the reach of children. Store pre-filled syringes in a refrigerator between 2 and 8 degrees C (36 and 46 degrees F). Do not freeze. Keep in carton to protect from light. Throw away this medicine if it is left out of the refrigerator for more than 48 hours. Throw away any unused medicine after the expiration date. NOTE: This sheet is a summary. It may not cover all possible information. If you have questions about this medicine, talk to your doctor, pharmacist, or health care provider.  2015, [employer]vier/Gold Standard. (2013-09-28 16:14:05)  

## 2014-07-22 ENCOUNTER — Encounter (HOSPITAL_COMMUNITY): Payer: Self-pay | Admitting: *Deleted

## 2014-07-22 ENCOUNTER — Encounter (HOSPITAL_COMMUNITY): Payer: Self-pay | Admitting: Anesthesiology

## 2014-07-22 ENCOUNTER — Inpatient Hospital Stay (HOSPITAL_COMMUNITY): Payer: Commercial Managed Care - PPO

## 2014-07-22 ENCOUNTER — Emergency Department (HOSPITAL_COMMUNITY): Payer: Commercial Managed Care - PPO

## 2014-07-22 ENCOUNTER — Inpatient Hospital Stay (HOSPITAL_COMMUNITY)
Admission: EM | Admit: 2014-07-22 | Discharge: 2014-08-13 | DRG: 208 | Disposition: E | Payer: Commercial Managed Care - PPO | Attending: Internal Medicine | Admitting: Internal Medicine

## 2014-07-22 DIAGNOSIS — R0602 Shortness of breath: Secondary | ICD-10-CM | POA: Diagnosis present

## 2014-07-22 DIAGNOSIS — Z791 Long term (current) use of non-steroidal anti-inflammatories (NSAID): Secondary | ICD-10-CM

## 2014-07-22 DIAGNOSIS — I469 Cardiac arrest, cause unspecified: Secondary | ICD-10-CM | POA: Diagnosis not present

## 2014-07-22 DIAGNOSIS — J189 Pneumonia, unspecified organism: Principal | ICD-10-CM

## 2014-07-22 DIAGNOSIS — I249 Acute ischemic heart disease, unspecified: Secondary | ICD-10-CM | POA: Diagnosis present

## 2014-07-22 DIAGNOSIS — N400 Enlarged prostate without lower urinary tract symptoms: Secondary | ICD-10-CM | POA: Diagnosis present

## 2014-07-22 DIAGNOSIS — C679 Malignant neoplasm of bladder, unspecified: Secondary | ICD-10-CM | POA: Diagnosis present

## 2014-07-22 DIAGNOSIS — K219 Gastro-esophageal reflux disease without esophagitis: Secondary | ICD-10-CM | POA: Diagnosis present

## 2014-07-22 DIAGNOSIS — Z823 Family history of stroke: Secondary | ICD-10-CM | POA: Diagnosis not present

## 2014-07-22 DIAGNOSIS — Z79899 Other long term (current) drug therapy: Secondary | ICD-10-CM | POA: Diagnosis not present

## 2014-07-22 DIAGNOSIS — Z7982 Long term (current) use of aspirin: Secondary | ICD-10-CM

## 2014-07-22 DIAGNOSIS — I4891 Unspecified atrial fibrillation: Secondary | ICD-10-CM | POA: Diagnosis not present

## 2014-07-22 DIAGNOSIS — Z87891 Personal history of nicotine dependence: Secondary | ICD-10-CM

## 2014-07-22 DIAGNOSIS — I10 Essential (primary) hypertension: Secondary | ICD-10-CM | POA: Diagnosis present

## 2014-07-22 DIAGNOSIS — Z8249 Family history of ischemic heart disease and other diseases of the circulatory system: Secondary | ICD-10-CM | POA: Diagnosis not present

## 2014-07-22 DIAGNOSIS — N289 Disorder of kidney and ureter, unspecified: Secondary | ICD-10-CM | POA: Diagnosis present

## 2014-07-22 DIAGNOSIS — Z8042 Family history of malignant neoplasm of prostate: Secondary | ICD-10-CM | POA: Diagnosis not present

## 2014-07-22 DIAGNOSIS — N529 Male erectile dysfunction, unspecified: Secondary | ICD-10-CM | POA: Diagnosis present

## 2014-07-22 DIAGNOSIS — R0902 Hypoxemia: Secondary | ICD-10-CM | POA: Diagnosis present

## 2014-07-22 DIAGNOSIS — I429 Cardiomyopathy, unspecified: Secondary | ICD-10-CM

## 2014-07-22 DIAGNOSIS — E872 Acidosis: Secondary | ICD-10-CM | POA: Diagnosis present

## 2014-07-22 DIAGNOSIS — D72829 Elevated white blood cell count, unspecified: Secondary | ICD-10-CM

## 2014-07-22 DIAGNOSIS — E782 Mixed hyperlipidemia: Secondary | ICD-10-CM | POA: Diagnosis present

## 2014-07-22 DIAGNOSIS — R748 Abnormal levels of other serum enzymes: Secondary | ICD-10-CM

## 2014-07-22 DIAGNOSIS — R579 Shock, unspecified: Secondary | ICD-10-CM

## 2014-07-22 DIAGNOSIS — I2699 Other pulmonary embolism without acute cor pulmonale: Secondary | ICD-10-CM

## 2014-07-22 LAB — CBC WITH DIFFERENTIAL/PLATELET
BASOS ABS: 0 10*3/uL (ref 0.0–0.1)
Basophils Relative: 0 % (ref 0–1)
EOS ABS: 0 10*3/uL (ref 0.0–0.7)
EOS PCT: 0 % (ref 0–5)
HCT: 34.4 % — ABNORMAL LOW (ref 39.0–52.0)
Hemoglobin: 11.1 g/dL — ABNORMAL LOW (ref 13.0–17.0)
LYMPHS PCT: 8 % — AB (ref 12–46)
Lymphs Abs: 2.8 10*3/uL (ref 0.7–4.0)
MCH: 26.1 pg (ref 26.0–34.0)
MCHC: 32.3 g/dL (ref 30.0–36.0)
MCV: 80.8 fL (ref 78.0–100.0)
MONO ABS: 1.1 10*3/uL — AB (ref 0.1–1.0)
Monocytes Relative: 3 % (ref 3–12)
Neutro Abs: 31.5 10*3/uL — ABNORMAL HIGH (ref 1.7–7.7)
Neutrophils Relative %: 89 % — ABNORMAL HIGH (ref 43–77)
PLATELETS: 179 10*3/uL (ref 150–400)
RBC: 4.26 MIL/uL (ref 4.22–5.81)
RDW: 16.6 % — AB (ref 11.5–15.5)
WBC: 35.4 10*3/uL — AB (ref 4.0–10.5)

## 2014-07-22 LAB — COMPREHENSIVE METABOLIC PANEL
ALT: 16 U/L (ref 0–53)
ANION GAP: 8 (ref 5–15)
AST: 34 U/L (ref 0–37)
Albumin: 3.7 g/dL (ref 3.5–5.2)
Alkaline Phosphatase: 122 U/L — ABNORMAL HIGH (ref 39–117)
BUN: 22 mg/dL (ref 6–23)
CHLORIDE: 107 meq/L (ref 96–112)
CO2: 24 mmol/L (ref 19–32)
Calcium: 9 mg/dL (ref 8.4–10.5)
Creatinine, Ser: 1.87 mg/dL — ABNORMAL HIGH (ref 0.50–1.35)
GFR calc Af Amer: 42 mL/min — ABNORMAL LOW (ref >90)
GFR, EST NON AFRICAN AMERICAN: 36 mL/min — AB (ref >90)
GLUCOSE: 164 mg/dL — AB (ref 70–99)
Potassium: 4 mmol/L (ref 3.5–5.1)
Sodium: 139 mmol/L (ref 135–145)
Total Bilirubin: 0.9 mg/dL (ref 0.3–1.2)
Total Protein: 6.8 g/dL (ref 6.0–8.3)

## 2014-07-22 LAB — URINE MICROSCOPIC-ADD ON

## 2014-07-22 LAB — TYPE AND SCREEN
ABO/RH(D): O POS
Antibody Screen: NEGATIVE

## 2014-07-22 LAB — BLOOD GAS, ARTERIAL
Acid-base deficit: 18.2 mmol/L — ABNORMAL HIGH (ref 0.0–2.0)
Bicarbonate: 10.7 mEq/L — ABNORMAL LOW (ref 20.0–24.0)
FIO2: 1 %
O2 SAT: 90.9 %
PO2 ART: 94.7 mmHg (ref 80.0–100.0)
Patient temperature: 98.6
TCO2: 10.7 mmol/L (ref 0–100)
pCO2 arterial: 37.2 mmHg (ref 35.0–45.0)
pH, Arterial: 7.085 — CL (ref 7.350–7.450)

## 2014-07-22 LAB — URINALYSIS, ROUTINE W REFLEX MICROSCOPIC
Bilirubin Urine: NEGATIVE
GLUCOSE, UA: NEGATIVE mg/dL
Ketones, ur: NEGATIVE mg/dL
Nitrite: POSITIVE — AB
PROTEIN: 30 mg/dL — AB
SPECIFIC GRAVITY, URINE: 1.013 (ref 1.005–1.030)
UROBILINOGEN UA: 0.2 mg/dL (ref 0.0–1.0)
pH: 7.5 (ref 5.0–8.0)

## 2014-07-22 LAB — TROPONIN I
TROPONIN I: 1.58 ng/mL — AB (ref <0.031)
Troponin I: 2.34 ng/mL (ref <0.031)

## 2014-07-22 LAB — PROTIME-INR
INR: 1.14 (ref 0.00–1.49)
Prothrombin Time: 14.7 seconds (ref 11.6–15.2)

## 2014-07-22 LAB — STREP PNEUMONIAE URINARY ANTIGEN: Strep Pneumo Urinary Antigen: NEGATIVE

## 2014-07-22 LAB — I-STAT CG4 LACTIC ACID, ED: LACTIC ACID, VENOUS: 3.27 mmol/L — AB (ref 0.5–2.2)

## 2014-07-22 LAB — HEPARIN LEVEL (UNFRACTIONATED): Heparin Unfractionated: <0.1 IU/mL — ABNORMAL LOW (ref 0.30–0.70)

## 2014-07-22 LAB — LIPASE, BLOOD: Lipase: 25 U/L (ref 11–59)

## 2014-07-22 LAB — PROCALCITONIN: Procalcitonin: 0.23 ng/mL

## 2014-07-22 LAB — BRAIN NATRIURETIC PEPTIDE: B Natriuretic Peptide: 695.9 pg/mL — ABNORMAL HIGH (ref 0.0–100.0)

## 2014-07-22 LAB — LACTIC ACID, PLASMA

## 2014-07-22 MED ORDER — HEPARIN (PORCINE) IN NACL 100-0.45 UNIT/ML-% IJ SOLN
850.0000 [IU]/h | INTRAMUSCULAR | Status: DC
  Administered 2014-07-22: 850 [IU]/h via INTRAVENOUS
  Filled 2014-07-22: qty 250

## 2014-07-22 MED ORDER — ONDANSETRON HCL 4 MG PO TABS: 4.0000 mg | ORAL_TABLET | Freq: Four times a day (QID) | ORAL | Status: DC | PRN

## 2014-07-22 MED ORDER — MORPHINE SULFATE 2 MG/ML IJ SOLN: 2.0000 mg | INTRAMUSCULAR | Status: DC | PRN

## 2014-07-22 MED ORDER — VANCOMYCIN HCL IN DEXTROSE 1-5 GM/200ML-% IV SOLN: 1000.0000 mg | INTRAVENOUS | Status: DC

## 2014-07-22 MED ORDER — SODIUM CHLORIDE 0.9 % IJ SOLN: 10.0000 mL | INTRAMUSCULAR | Status: DC | PRN

## 2014-07-22 MED ORDER — HEPARIN BOLUS VIA INFUSION
4000.0000 [IU] | Freq: Once | INTRAVENOUS | Status: AC
  Administered 2014-07-22: 4000 [IU] via INTRAVENOUS
  Filled 2014-07-22: qty 4000

## 2014-07-22 MED ORDER — FENTANYL CITRATE 0.05 MG/ML IJ SOLN: 50.0000 ug | INTRAMUSCULAR | Status: DC | PRN

## 2014-07-22 MED ORDER — SODIUM BICARBONATE 8.4 % IV SOLN
INTRAVENOUS | Status: DC
  Filled 2014-07-22 (×2): qty 150

## 2014-07-22 MED ORDER — ENSURE COMPLETE PO LIQD
237.0000 mL | Freq: Two times a day (BID) | ORAL | Status: DC
  Administered 2014-07-22: 237 mL via ORAL

## 2014-07-22 MED ORDER — OXYCODONE-ACETAMINOPHEN 5-325 MG PO TABS: 1.0000 | ORAL_TABLET | Freq: Four times a day (QID) | ORAL | Status: DC | PRN

## 2014-07-22 MED ORDER — AMLODIPINE BESYLATE 10 MG PO TABS: 10.0000 mg | ORAL_TABLET | Freq: Every morning | ORAL | Status: DC

## 2014-07-22 MED ORDER — CARVEDILOL 12.5 MG PO TABS
12.5000 mg | ORAL_TABLET | Freq: Two times a day (BID) | ORAL | Status: DC
  Administered 2014-07-22: 12.5 mg via ORAL
  Filled 2014-07-22 (×2): qty 1

## 2014-07-22 MED ORDER — ACETAMINOPHEN 650 MG RE SUPP: 650.0000 mg | Freq: Four times a day (QID) | RECTAL | Status: DC | PRN

## 2014-07-22 MED ORDER — ASPIRIN 81 MG PO CHEW
81.0000 mg | CHEWABLE_TABLET | Freq: Every morning | ORAL | Status: DC
  Filled 2014-07-22 (×2): qty 1

## 2014-07-22 MED ORDER — DEXTROSE 5 % IV SOLN
1.0000 g | INTRAVENOUS | Status: DC
  Administered 2014-07-22: 1 g via INTRAVENOUS
  Filled 2014-07-22: qty 10

## 2014-07-22 MED ORDER — VASOPRESSIN 20 UNIT/ML IV SOLN: 0.0300 [IU]/min | INTRAVENOUS | Status: DC

## 2014-07-22 MED ORDER — ONDANSETRON HCL 4 MG/2ML IJ SOLN: 4.0000 mg | Freq: Four times a day (QID) | INTRAMUSCULAR | Status: DC | PRN

## 2014-07-22 MED ORDER — DOPAMINE-DEXTROSE 3.2-5 MG/ML-% IV SOLN: 0.0000 ug/kg/min | INTRAVENOUS | Status: DC

## 2014-07-22 MED ORDER — DEXTROSE 5 % IV SOLN
500.0000 mg | INTRAVENOUS | Status: DC
  Administered 2014-07-22: 500 mg via INTRAVENOUS
  Filled 2014-07-22: qty 500

## 2014-07-22 MED ORDER — INSULIN ASPART 100 UNIT/ML ~~LOC~~ SOLN: 0.0000 [IU] | SUBCUTANEOUS | Status: DC

## 2014-07-22 MED ORDER — LEVOFLOXACIN IN D5W 750 MG/150ML IV SOLN
750.0000 mg | INTRAVENOUS | Status: DC
  Filled 2014-07-22: qty 150

## 2014-07-22 MED ORDER — CETYLPYRIDINIUM CHLORIDE 0.05 % MT LIQD: 7.0000 mL | Freq: Four times a day (QID) | OROMUCOSAL | Status: DC

## 2014-07-22 MED ORDER — CHLORHEXIDINE GLUCONATE 0.12 % MT SOLN: 15.0000 mL | Freq: Two times a day (BID) | OROMUCOSAL | Status: DC

## 2014-07-22 MED ORDER — SODIUM CHLORIDE 0.9 % IV SOLN
INTRAVENOUS | Status: DC
  Administered 2014-07-22: 17:00:00 via INTRAVENOUS

## 2014-07-22 MED ORDER — SODIUM CHLORIDE 0.9 % IJ SOLN: 3.0000 mL | Freq: Two times a day (BID) | INTRAMUSCULAR | Status: DC

## 2014-07-22 MED ORDER — ASPIRIN 325 MG PO TABS
325.0000 mg | ORAL_TABLET | Freq: Once | ORAL | Status: AC
  Administered 2014-07-22: 325 mg via ORAL
  Filled 2014-07-22: qty 1

## 2014-07-22 MED ORDER — PRAVASTATIN SODIUM 20 MG PO TABS: 20.0000 mg | ORAL_TABLET | Freq: Every day | ORAL | Status: DC

## 2014-07-22 MED ORDER — DEXTROSE 5 % IV SOLN
2.0000 g | INTRAVENOUS | Status: DC
  Filled 2014-07-22: qty 2

## 2014-07-22 MED ORDER — TECHNETIUM TO 99M ALBUMIN AGGREGATED
5.5000 | Freq: Once | INTRAVENOUS | Status: AC | PRN
  Administered 2014-07-22: 5.5 via INTRAVENOUS

## 2014-07-22 MED ORDER — ACETAMINOPHEN 160 MG/5ML PO SOLN: 650.0000 mg | Freq: Four times a day (QID) | ORAL | Status: DC | PRN

## 2014-07-22 MED ORDER — SODIUM BICARBONATE 8.4 % IV SOLN
INTRAVENOUS | Status: AC
  Filled 2014-07-22: qty 50

## 2014-07-22 MED ORDER — ALBUTEROL SULFATE (2.5 MG/3ML) 0.083% IN NEBU: 2.5000 mg | INHALATION_SOLUTION | RESPIRATORY_TRACT | Status: DC | PRN

## 2014-07-22 MED ORDER — TECHNETIUM TC 99M DIETHYLENETRIAME-PENTAACETIC ACID: 44.0000 | Freq: Once | INTRAVENOUS | Status: DC | PRN

## 2014-07-22 MED ORDER — NOREPINEPHRINE BITARTRATE 1 MG/ML IV SOLN
2.0000 ug/min | INTRAVENOUS | Status: DC
  Filled 2014-07-22: qty 4

## 2014-07-22 MED ORDER — ACETAMINOPHEN 325 MG PO TABS: 650.0000 mg | ORAL_TABLET | Freq: Four times a day (QID) | ORAL | Status: DC | PRN

## 2014-07-22 MED FILL — Medication: Qty: 1 | Status: AC

## 2014-07-23 LAB — HIV ANTIBODY (ROUTINE TESTING W REFLEX): HIV-1/HIV-2 Ab: NONREACTIVE

## 2014-07-23 NOTE — Progress Notes (Signed)
Utilization review completed.  

## 2014-07-23 NOTE — Discharge Summary (Addendum)
Death Summary  Kenneth Lawrence VUY:[id number] DOB: Jul 22, 1950 DOA: 01-Aug-2014  PCP: [name], MD  Admit date: 2014-08-01 Date of Death: 08-01-14  Final Diagnoses:  Active Problems:   Bladder cancer   Hyperlipidemia, mixed   Essential hypertension, benign   Cardiomyopathy   Leukocytosis   CAP (community acquired pneumonia)   Hypoxia   History of present illness:  Please see admit h and p from 1/10 for details. Briefly, the patient presented to the ED with complaints of worsening sob and was found to be mildly hypoxic. The patient was admitted for further work up.  Hospital Course:  The patient was admitted to the floor and continued on continuous cardiac monitoring. Cardiology was consulted through the Emergency Department. The patient was started on empiric rocephin and azithromycin to cover possible presenting pneumonia. The patient was also continued on IVF as tolerated for presenting lactate of just over 3. Serum bicarb was 24, of note. Given patient's history of bladder cancer, the possibility of PE was of a concern. The patient was started on empiric heparin gtt and the patient underwent a VQ scan to rule out PE. VQ ultimately yielded no evidence of PE. Heparin gtt was continued since the patient was found to have an elevated troponin of 2.34 with possible EKG changes on admit, pending Cardiology evaluation.  On the evening of 1/10, the patient was noted to have a syncopal episode while going to the bathroom. [facility] was called. See documented events from [facility] for details. Unfortunately, despite maximal intervention and prolonged CPR, the patient was unable to maintain a pulse and was later pronounced at 1820.  Family remained informed and updated throughout the day. All question had been answered.  Time of death: 70  Signed:  CHIU, Orpah Melter  [facility] 07/23/2014, 5:57 PM

## 2014-07-24 LAB — LEGIONELLA ANTIGEN, URINE

## 2014-07-28 LAB — CULTURE, BLOOD (ROUTINE X 2)
CULTURE: NO GROWTH
Culture: NO GROWTH
Culture: NO GROWTH
Culture: NO GROWTH

## 2014-08-03 ENCOUNTER — Other Ambulatory Visit: Payer: Commercial Managed Care - PPO

## 2014-08-03 ENCOUNTER — Ambulatory Visit: Payer: Commercial Managed Care - PPO | Admitting: Physician Assistant

## 2014-08-03 ENCOUNTER — Ambulatory Visit: Payer: Commercial Managed Care - PPO

## 2014-08-04 ENCOUNTER — Ambulatory Visit: Payer: Commercial Managed Care - PPO

## 2014-08-09 ENCOUNTER — Encounter: Payer: Self-pay | Admitting: Oncology

## 2014-08-09 NOTE — Progress Notes (Unsigned)
08/09/2014 I have entered this chart to print medical information requested by [facility].  The claim of [phone] was DENIED because of lack in information.   Stanford Breed

## 2014-08-13 NOTE — Progress Notes (Signed)
eLink Physician-Brief Progress Note Patient Name: Kenneth Lawrence DOB: [birth year] MRN: [id number]   Date of Service  07-26-14  HPI/Events of Note  64 yo male with hx of stage IV bladder cancer on chemo presented with progressive dyspnea, cough, hypoxia, chest pain.  He was started on Abx for PNA, and ASA/heparin for possible NSTEMI.  He had syncopal event in bathroom and then developed cardiac arrest.  He had prolonged rounds of CPR and was intubated.  He has severe metabolic acidosis.  He was started on several pressors, given epi/atropine/HCO3/IV fluid.  He eventually developed asystole and expired.     Intervention Category Major Interventions: Other:  Kenneth Lawrence 07-26-2014, 6:26 PM

## 2014-08-13 NOTE — Progress Notes (Signed)
ANTICOAGULATION CONSULT NOTE - Initial Consult  Pharmacy Consult for Heparin Indication: chest pain/ACS  No Known Allergies  Patient Measurements: Height: 5\' 9"  (175.3 cm) Weight: 154 lb (69.854 kg) IBW/kg (Calculated) : 70.7 Heparin Dosing Weight: 69.9 kg  Vital Signs: Temp: 97.3 F (36.3 C) (01/10 0810) Temp Source: Oral (01/10 0810) BP: 97/64 mmHg (01/10 1000) Pulse Rate: 82 (01/10 1000)  Labs:  Recent Labs  Jul 24, 2014 0827  HGB 11.1*  HCT 34.4*  PLT 179  LABPROT 14.7  INR 1.14  CREATININE 1.87*  TROPONINI 2.34*    Estimated Creatinine Clearance: 38.9 mL/min (by C-G formula based on Cr of 1.87).   Medical History: Past Medical History  Diagnosis Date  . Hypertension   . Bladder mass   . Prostate enlargement   . Hyperlipidemia   . Headache(784.0)     hx. migraines  . LV dysfunction     resolved by 2012 stress test. EF 52%  . Erectile dysfunction   . Nonspecific abnormal unspecified cardiovascular function study     EF 52% in 10/12. Basal inferior wall defect. 7:45 on treadmill.  . Cardiomyopathy     Result  . Cancer     bladder cancer-hx. of past tumors  . Bladder cancer     25 yr history    Medications:   (Not in a hospital admission) Scheduled:  Infusions:   Assessment: 63yo M presents with SOB and CP with exertion over the last 2-3 days. Troponin is elevated. Pharmacy is asked to start heparin for ACS. Baseline CBC ok. PT/INR wnl. SCr elevated, CrCl ~39. No record of renal dysfunction.  Goal of Therapy:  Heparin level 0.3-0.7 units/ml Monitor platelets by anticoagulation protocol: Yes   Plan:  Give heparin 4000units IV bolus then start infusion at 850units/hr. Check heparin level in 8hrs and daily thereafter. Check CBC q24h while on heparin. F/u daily.  Romeo Rabon, PharmD, pager [phone]. July 24, 2014,10:28 AM.

## 2014-08-13 NOTE — ED Notes (Signed)
Upon further investigation, pt reports pounding in his chest with pain 8/10 when exerting himself. Relieved with rest. Hx cardiomyopathy per his cardiologist who told him his "heart function is low and may have to have a stent placed eventually."

## 2014-08-13 NOTE — ED Provider Notes (Signed)
CSN: [id number]     Arrival date & time 08/14/14  0800 History   First MD Initiated Contact with Patient 08-14-14 0802     Chief Complaint  Patient presents with  . Shortness of Breath     (Consider location/radiation/quality/duration/timing/severity/associated sxs/prior Treatment) HPI The patient has been developing shortness of breath for about 3 days. He reports he feels all right at rest but with exertion he becomes very short of breath and fatigue. He reports he can do minor activities in the house, but anything beyond makes him exceedingly short of breath. He reports he also has been experiencing significant increased general fatigue. He denies any pain associated with this. He has had no associated fever. He reports minimal cough. He reports occasionally he feels that he is a little mucus in his throat that he has to clear. There has been no vomiting or diarrhea. The patient does endorse dark appearing stools. He is not on any blood thinners. He is being treated for bladder cancer. He denies he's been having any abdominal pain. He has had no lower extremity swelling or calf pain. No prior history of DVT. The patient does have a past smoking history. He is not a current smoker. Cumulatively he has 30 years of smoking approximately 5 cigarettes per day. He denies any formal diagnosis of COPD or emphysema. Past Medical History  Diagnosis Date  . Hypertension   . Bladder mass   . Prostate enlargement   . Hyperlipidemia   . Headache(784.0)     hx. migraines  . LV dysfunction     resolved by 2012 stress test. EF 52%  . Erectile dysfunction   . Nonspecific abnormal unspecified cardiovascular function study     EF 52% in 10/12. Basal inferior wall defect. 7:45 on treadmill.  . Cardiomyopathy     Result  . Cancer     bladder cancer-hx. of past tumors  . Bladder cancer     25 yr history   Past Surgical History  Procedure Laterality Date  . Bladder tumor excision      Multiple times ,  none in 10 yrs  . Transurethral resection of bladder tumor N/A 03/16/2013    Procedure: TRANSURETHRAL RESECTION OF BLADDER TUMOR (TURBT);  Surgeon: Ailene Rud, MD;  Location: [facility];  Service: Urology;  Laterality: N/A;  . Robot assisted laparoscopic complete cystect ileal conduit N/A 08/11/2013    Procedure: ROBOTIC ASSISTED LAPAROSCOPIC COMPLETE CYSTECTOMY, PROSTATECTOMY,  ILEAL CONDUIT , ALSO CYSTOSCOPY, INDOCYANINE GREEN DYE INJECTION OF TUMOR;  Surgeon: Alexis Frock, MD;  Location: [facility];  Service: Urology;  Laterality: N/A;  . Lymphadenectomy Bilateral 08/11/2013    Procedure: LYMPHADENECTOMY;  Surgeon: Alexis Frock, MD;  Location: [facility];  Service: Urology;  Laterality: Bilateral;   Family History  Problem Relation Age of Onset  . Cancer Mother     breast  . Cancer Father     brain  . Heart disease Father   . Stroke Father   . Prostate cancer Father   . Cancer Sister     breast in 1 sister   History  Substance Use Topics  . Smoking status: Former Smoker -- 0.50 packs/day    Types: Cigarettes    Quit date: 03/16/2013  . Smokeless tobacco: Not on file     Comment: smoked, quit x 20 yrs, then quit 2 weeks ago  . Alcohol Use: Yes     Comment: occ to rare beer    Review of Systems  10 Systems reviewed and are negative for acute change except as noted in the HPI.   Allergies  Review of patient's allergies indicates no known allergies.  Home Medications   Prior to Admission medications   Medication Sig Start Date End Date Taking? Authorizing Provider  acetaminophen (TYLENOL) 500 MG tablet Take 500 mg by mouth every 6 (six) hours as needed (For back pain.).   Yes Historical Provider, MD  amLODipine (NORVASC) 10 MG tablet Take 10 mg by mouth every morning.   Yes Historical Provider, MD  Ascorbic Acid (VITAMIN C PO) Take 1 tablet by mouth every morning.   Yes Historical Provider, MD  aspirin 81 MG chewable tablet Chew 81 mg by mouth every morning.   Yes  Historical Provider, MD  carvedilol (COREG) 12.5 MG tablet Take 12.5 mg by mouth 2 (two) times daily with a meal.   Yes Historical Provider, MD  Cholecalciferol (VITAMIN D-3 PO) Take 1 tablet by mouth every morning.   Yes Historical Provider, MD  hydrochlorothiazide (HYDRODIURIL) 25 MG tablet Take 25 mg by mouth every morning.   Yes Historical Provider, MD  lidocaine-prilocaine (EMLA) cream Apply 1 application topically daily as needed (for port). port-cath   Yes Historical Provider, MD  Omega-3 Fatty Acids (FISH OIL PO) Take 1 capsule by mouth 2 (two) times daily.   Yes Historical Provider, MD  ondansetron (ZOFRAN) 8 MG tablet Take 1 tablet (8 mg total) by mouth every 8 (eight) hours as needed for nausea or vomiting. 06/15/14  Yes Wyatt Portela, MD  oxyCODONE-acetaminophen (PERCOCET/ROXICET) 5-325 MG per tablet Take 1-2 tablets by mouth every 6 (six) hours as needed for moderate pain. Post-operatively. 08/17/13  Yes Alexis Frock, MD  potassium chloride SA (K-DUR,KLOR-CON) 20 MEQ tablet Take 20 mEq by mouth every morning.    Yes Historical Provider, MD  pravastatin (PRAVACHOL) 20 MG tablet Take 20 mg by mouth every morning.   Yes Historical Provider, MD  PRESCRIPTION MEDICATION Receiving chemotherapy at [facility] with Docetaxel every  21 days.  Last treatment on 07/12/14.  Neulasta given 07/14/14. (Dr. Alen Blew)   Yes Historical Provider, MD  senna-docusate (SENOKOT-S) 8.6-50 MG per tablet Take 1 tablet by mouth 2 (two) times daily. While taking pain meds to prevent constipation Patient taking differently: Take 1 tablet by mouth 2 (two) times daily as needed. For constipation. 08/17/13  Yes Alexis Frock, MD   BP 106/64 mmHg  Pulse 93  Temp(Src) 97.4 F (36.3 C) (Oral)  Resp 20  Ht 5\' 9"  (1.753 m)  Wt 154 lb (69.854 kg)  BMI 22.73 kg/m2  SpO2 91% Physical Exam  Constitutional: He is oriented to person, place, and time. He appears well-developed and well-nourished.  HENT:  Head: Normocephalic and  atraumatic.  Eyes: EOM are normal. Pupils are equal, round, and reactive to light.  Neck: Neck supple.  Cardiovascular: Normal rate, regular rhythm, normal heart sounds and intact distal pulses.   Pulmonary/Chest: Breath sounds normal.  The patient appears mildly tachypnea. He is speaking in full sentences. His breath sounds are clear with good airflow.  Abdominal: Soft. Bowel sounds are normal. He exhibits no distension. There is no tenderness.  The patient has a urostomy bag present.  Musculoskeletal: Normal range of motion. He exhibits no edema or tenderness.  Neurological: He is alert and oriented to person, place, and time. He has normal strength. Coordination normal. GCS eye subscore is 4. GCS verbal subscore is 5. GCS motor subscore is 6.  Skin: Skin is warm,  dry and intact. There is pallor.  Psychiatric: He has a normal mood and affect.    ED Course  Procedures (including critical care time) Labs Review Labs Reviewed  COMPREHENSIVE METABOLIC PANEL - Abnormal; Notable for the following:    Glucose, Bld 164 (*)    Creatinine, Ser 1.87 (*)    Alkaline Phosphatase 122 (*)    GFR calc non Af Amer 36 (*)    GFR calc Af Amer 42 (*)    All other components within normal limits  TROPONIN I - Abnormal; Notable for the following:    Troponin I 2.34 (*)    All other components within normal limits  CBC WITH DIFFERENTIAL - Abnormal; Notable for the following:    WBC 35.4 (*)    Hemoglobin 11.1 (*)    HCT 34.4 (*)    RDW 16.6 (*)    Neutrophils Relative % 89 (*)    Lymphocytes Relative 8 (*)    Neutro Abs 31.5 (*)    Monocytes Absolute 1.1 (*)    All other components within normal limits  URINALYSIS, ROUTINE W REFLEX MICROSCOPIC - Abnormal; Notable for the following:    Hgb urine dipstick TRACE (*)    Protein, ur 30 (*)    Nitrite POSITIVE (*)    Leukocytes, UA SMALL (*)    All other components within normal limits  BRAIN NATRIURETIC PEPTIDE - Abnormal; Notable for the  following:    B Natriuretic Peptide 695.9 (*)    All other components within normal limits  HEPARIN LEVEL (UNFRACTIONATED) - Abnormal; Notable for the following:    Heparin Unfractionated <0.10 (*)    All other components within normal limits  TROPONIN I - Abnormal; Notable for the following:    Troponin I 1.58 (*)    All other components within normal limits  URINE MICROSCOPIC-ADD ON - Abnormal; Notable for the following:    Bacteria, UA MANY (*)    All other components within normal limits  BLOOD GAS, ARTERIAL - Abnormal; Notable for the following:    pH, Arterial 7.085 (*)    Bicarbonate 10.7 (*)    Acid-base deficit 18.2 (*)    All other components within normal limits  LACTIC ACID, PLASMA - Abnormal; Notable for the following:    Lactic Acid, Venous <0.3 (*)    All other components within normal limits  I-STAT CG4 LACTIC ACID, ED - Abnormal; Notable for the following:    Lactic Acid, Venous 3.27 (*)    All other components within normal limits  CULTURE, BLOOD (ROUTINE X 2)  CULTURE, BLOOD (ROUTINE X 2)  CULTURE, BLOOD (ROUTINE X 2)  CULTURE, BLOOD (ROUTINE X 2)  CULTURE, EXPECTORATED SPUTUM-ASSESSMENT  GRAM STAIN  CULTURE, RESPIRATORY (NON-EXPECTORATED)  LIPASE, BLOOD  PROTIME-INR  HIV ANTIBODY (ROUTINE TESTING)  LEGIONELLA ANTIGEN, URINE  STREP PNEUMONIAE URINARY ANTIGEN  PROCALCITONIN  TYPE AND SCREEN    Imaging Review No results found.   EKG Interpretation   Date/Time:  2014/08/13 08:10:10 EST Ventricular Rate:  94 PR Interval:  179 QRS Duration: 91 QT Interval:  405 QTC Calculation: 506 R Axis:   -13 Text Interpretation:  Sinus rhythm Anteroseptal infarct, age indeterminate  Prolonged QT interval dynamic anterior t waves. Ischemic appearance  Confirmed by Johnney Killian, MD, Jeannie Done [phone]) on August 13, 2014 9:41:00 AM      CRITICAL CARE Performed by: Charlesetta Shanks   Total critical care time: 40  Critical care time was exclusive of separately  billable procedures and  treating other patients.  Critical care was necessary to treat or prevent imminent or life-threatening deterioration.  Critical care was time spent personally by me on the following activities: development of treatment plan with patient and/or surrogate as well as nursing, discussions with consultants, evaluation of patient's response to treatment, examination of patient, obtaining history from patient or surrogate, ordering and performing treatments and interventions, ordering and review of laboratory studies, ordering and review of radiographic studies, pulse oximetry and re-evaluation of patient's condition.   Consult: Cardiology, the case was reviewed and diagnostic studies reviewed with EKG, history and troponin. At this point patient will be further evaluated by consultation. The patient will be empiracally started on heparin and at this time he does not have a ST segment elevation MI but we will continue rule out for possible nSTEMI. MDM   Final diagnoses:  Acute coronary syndrome  Malignant neoplasm of urinary bladder, unspecified site  Renal insufficiency   The patient will be admitted to medicine with the plan of initial treatment for community acquired pneumonia based on chest x-ray and significant leukocytosis. Other consideration is for possible cardiac ischemic event without ST segment elevation MI as well as possible PE. After discussion with the hospitalist, the plan at this time will be to empirically start the patient on heparin and obtain VQ scan due to the patient's renal insufficiency. Cardiology has been consult is for further evaluation regarding troponin elevation and EKG changes. The patient has clear mental status and no respiratory distress. He is not having any complaints of chest pain.     Charlesetta Shanks, MD 07/27/14 [phone]

## 2014-08-13 NOTE — Significant Event (Signed)
See events for code blue. Pt noted to have syncopal episode this evening. Family reports pt synopsized while in bathroom. Pt initially noted to have palpable pulse. Pt intubated and pt later noted to have pulseless rhythm. CPR was initiated with pulse regained. The patient was transferred to ICU where pt was noted to have slowing HR and weaker pulse requiring further chest compressions. Patient required pressors as well as bicarb. Despite maximal efforts, pt's pulse continued to fade. Pt eventually noted to be in PEA. Pt pronounced at 1820.  Family have been updated at bedside. All questions answered.

## 2014-08-13 NOTE — ED Notes (Signed)
Pt reports SOB with exertion over last 2-3 days. Cough, small amount of mucus. Denies pain. Denies Hx CHF, COPD. Denies swelling in lower extremities. 88% O2 on RA.Placed on 2L O2 . Sts a few days ago had what felt like gas in upper abdomen, lower chest. Once he passed gas and burped, felt like it went away. Denies cardiac Hx.

## 2014-08-13 NOTE — Consult Note (Signed)
Reason for Consult: Elevated troponin  Requesting Physician: Wyline Copas  Cardiologist: None  HPI: This is a 64 y.o. male with a past medical history significant for advanced bladder cancer on chemotherapy admitted with dyspnea, hypoxia and lactic acidosis earlier today, markedly elevated WBC and suspected pneumonia. V/Q negative for PE, but cardiac enzymes were modestly elevated. No true chest pain except reflux relieved by belching.  Heparin and aspirin have been administered. The patient collapsed and became unresponsive after using the bathroom. When I arrived, CPR was in progress. Review of telemetry shows that he always had a narrow complex rhythm, mostly sinus (sometimes difficult to see due to artifact). After CPR and chest compression, intubation and bag-ventilation and 1 mg IV epinephrine, a perfusing rhythm was restored, but after several minutes the rhythm became progressively slower and the BP decreased until a pulse was no longer palpated. Repeat CPR, epi and atropine restored normal rhythm long enough to transfer to the ICU. Atrial fibrillation occurred briefly after IV epiABG showed pH 7.08, pCO2 37, pO2 95. CPR had to be resumed, IV NaHCO3 was administered as well as more IV epi and atropine, subsequently vasopressin and infusions of norepinephrine and dopamine. At least 4 separate "codes" were necessary. A stable, relatively slow sinus rhythm was established, with thready pulses for a while, but even on high dose pressors, spontaneous circulation was lost, consistent with PEA. VF/VT was never seen during resuscitation. Overall, more than 60 minutes of intermittent CPR/code.  Limited history available. Echo 2011 showed LVEF 45-50% and mild left atrial enlargement. Nuclear stress test 2012 EF 52%. No coronary calcium deposits are seen on recent chest CT. There is some atherosclerotic calcification in the aortic arch.  PMHx:  Past Medical History  Diagnosis Date  . Hypertension     . Bladder mass   . Prostate enlargement   . Hyperlipidemia   . Headache(784.0)     hx. migraines  . LV dysfunction     resolved by 2012 stress test. EF 52%  . Erectile dysfunction   . Nonspecific abnormal unspecified cardiovascular function study     EF 52% in 10/12. Basal inferior wall defect. 7:45 on treadmill.  . Cardiomyopathy     Result  . Cancer     bladder cancer-hx. of past tumors  . Bladder cancer     25 yr history   Past Surgical History  Procedure Laterality Date  . Bladder tumor excision      Multiple times , none in 10 yrs  . Transurethral resection of bladder tumor N/A 03/16/2013    Procedure: TRANSURETHRAL RESECTION OF BLADDER TUMOR (TURBT);  Surgeon: Ailene Rud, MD;  Location: [facility];  Service: Urology;  Laterality: N/A;  . Robot assisted laparoscopic complete cystect ileal conduit N/A 08/11/2013    Procedure: ROBOTIC ASSISTED LAPAROSCOPIC COMPLETE CYSTECTOMY, PROSTATECTOMY,  ILEAL CONDUIT , ALSO CYSTOSCOPY, INDOCYANINE GREEN DYE INJECTION OF TUMOR;  Surgeon: Alexis Frock, MD;  Location: [facility];  Service: Urology;  Laterality: N/A;  . Lymphadenectomy Bilateral 08/11/2013    Procedure: LYMPHADENECTOMY;  Surgeon: Alexis Frock, MD;  Location: [facility];  Service: Urology;  Laterality: Bilateral;    FAMHx: Family History  Problem Relation Age of Onset  . Cancer Mother     breast  . Cancer Father     brain  . Heart disease Father   . Stroke Father   . Prostate cancer Father   . Cancer Sister     breast in 1 sister  SOCHx:  reports that he quit smoking about 16 months ago. His smoking use included Cigarettes. He smoked 0.50 packs per day. He does not have any smokeless tobacco history on file. He reports that he drinks alcohol. He reports that he does not use illicit drugs.  ALLERGIES: No Known Allergies  ROS: Cannot be obtained  HOME MEDICATIONS: Prescriptions prior to admission  Medication Sig Dispense Refill Last Dose  . acetaminophen  (TYLENOL) 500 MG tablet Take 500 mg by mouth every 6 (six) hours as needed (For back pain.).   unknown  . amLODipine (NORVASC) 10 MG tablet Take 10 mg by mouth every morning.   07-25-2014 at 0930  . Ascorbic Acid (VITAMIN C PO) Take 1 tablet by mouth every morning.   Past Week at Unknown time  . aspirin 81 MG chewable tablet Chew 81 mg by mouth every morning.   Past Week at Unknown time  . carvedilol (COREG) 12.5 MG tablet Take 12.5 mg by mouth 2 (two) times daily with a meal.   07/25/14 at 0930  . Cholecalciferol (VITAMIN D-3 PO) Take 1 tablet by mouth every morning.   Past Week at Unknown time  . hydrochlorothiazide (HYDRODIURIL) 25 MG tablet Take 25 mg by mouth every morning.   July 25, 2014 at 0930  . lidocaine-prilocaine (EMLA) cream Apply 1 application topically daily as needed (for port). port-cath   07/12/2014 at Unknown time  . Omega-3 Fatty Acids (FISH OIL PO) Take 1 capsule by mouth 2 (two) times daily.   Past Week at Unknown time  . ondansetron (ZOFRAN) 8 MG tablet Take 1 tablet (8 mg total) by mouth every 8 (eight) hours as needed for nausea or vomiting. 30 tablet 0 2014-07-25 at Unknown time  . oxyCODONE-acetaminophen (PERCOCET/ROXICET) 5-325 MG per tablet Take 1-2 tablets by mouth every 6 (six) hours as needed for moderate pain. Post-operatively. 40 tablet 0 unkown  . potassium chloride SA (K-DUR,KLOR-CON) 20 MEQ tablet Take 20 mEq by mouth every morning.    2014/07/25 at 0930  . pravastatin (PRAVACHOL) 20 MG tablet Take 20 mg by mouth every morning.   2014-07-25 at 0930  . PRESCRIPTION MEDICATION Receiving chemotherapy at [facility] with Docetaxel every  21 days.  Last treatment on 07/12/14.  Neulasta given 07/14/14. (Dr. Alen Blew)   07/12/2014  . senna-docusate (SENOKOT-S) 8.6-50 MG per tablet Take 1 tablet by mouth 2 (two) times daily. While taking pain meds to prevent constipation (Patient taking differently: Take 1 tablet by mouth 2 (two) times daily as needed. For constipation.) 20 tablet 1 unknown     HOSPITAL MEDICATIONS: Prior to Admission:  Prescriptions prior to admission  Medication Sig Dispense Refill Last Dose  . acetaminophen (TYLENOL) 500 MG tablet Take 500 mg by mouth every 6 (six) hours as needed (For back pain.).   unknown  . amLODipine (NORVASC) 10 MG tablet Take 10 mg by mouth every morning.   2014/07/25 at 0930  . Ascorbic Acid (VITAMIN C PO) Take 1 tablet by mouth every morning.   Past Week at Unknown time  . aspirin 81 MG chewable tablet Chew 81 mg by mouth every morning.   Past Week at Unknown time  . carvedilol (COREG) 12.5 MG tablet Take 12.5 mg by mouth 2 (two) times daily with a meal.   Jul 25, 2014 at 0930  . Cholecalciferol (VITAMIN D-3 PO) Take 1 tablet by mouth every morning.   Past Week at Unknown time  . hydrochlorothiazide (HYDRODIURIL) 25 MG tablet Take 25 mg by mouth every  morning.   August 21, 2014 at 0930  . lidocaine-prilocaine (EMLA) cream Apply 1 application topically daily as needed (for port). port-cath   07/12/2014 at Unknown time  . Omega-3 Fatty Acids (FISH OIL PO) Take 1 capsule by mouth 2 (two) times daily.   Past Week at Unknown time  . ondansetron (ZOFRAN) 8 MG tablet Take 1 tablet (8 mg total) by mouth every 8 (eight) hours as needed for nausea or vomiting. 30 tablet 0 08-21-14 at Unknown time  . oxyCODONE-acetaminophen (PERCOCET/ROXICET) 5-325 MG per tablet Take 1-2 tablets by mouth every 6 (six) hours as needed for moderate pain. Post-operatively. 40 tablet 0 unkown  . potassium chloride SA (K-DUR,KLOR-CON) 20 MEQ tablet Take 20 mEq by mouth every morning.    08/21/14 at 0930  . pravastatin (PRAVACHOL) 20 MG tablet Take 20 mg by mouth every morning.   2014/08/21 at 0930  . PRESCRIPTION MEDICATION Receiving chemotherapy at [facility] with Docetaxel every  21 days.  Last treatment on 07/12/14.  Neulasta given 07/14/14. (Dr. Alen Blew)   07/12/2014  . senna-docusate (SENOKOT-S) 8.6-50 MG per tablet Take 1 tablet by mouth 2 (two) times daily. While taking pain meds  to prevent constipation (Patient taking differently: Take 1 tablet by mouth 2 (two) times daily as needed. For constipation.) 20 tablet 1 unknown   Scheduled: . [START ON 07/23/2014] antiseptic oral rinse  7 mL Mouth Rinse QID  . aspirin  81 mg Oral q morning - 10a  . ceFEPime (MAXIPIME) IV  2 g Intravenous Q24H  . chlorhexidine  15 mL Mouth Rinse BID  . feeding supplement (ENSURE COMPLETE)  237 mL Oral BID BM  . insulin aspart  0-15 Units Subcutaneous 6 times per day  . levofloxacin (LEVAQUIN) IV  750 mg Intravenous Q48H  . sodium bicarbonate      . sodium chloride  3 mL Intravenous Q12H  . vancomycin  1,000 mg Intravenous Q24H    VITALS: Blood pressure 106/64, pulse 93, temperature 97.4 F (36.3 C), temperature source Oral, resp. rate 20, height 5\' 9"  (1.753 m), weight 154 lb (69.854 kg), SpO2 91 %.  PHYSICAL EXAM: Exam limited by ongoing efforts at resuscitation General: intubated, unresponsive to any stimulation. Pale Head: no evidence of trauma, midriasis, no exophtalmos or lid lag, no myxedema, no xanthelasma; normal ears, nose and oropharynx Neck: normal jugular venous pulsations; brisk carotid pulses without delay and no carotid bruits Chest: clear to auscultation anteriorly, no signs of consolidation by percussion or palpation. Cardiovascular: normal position and quality of the apical impulse, regular rhythm, normal first heart sound and normal second heart sound, no rubs or gallops, no murmur Abdomen: no tenderness or distention, no masses by palpation, no abnormal pulsatility or arterial bruits, normal bowel sounds, no hepatosplenomegaly. RLQ urostomy. Extremities: no clubbing, cyanosis;  no edema;  2+ femoral pulses after IV epi;  Neurological: cannot be assessed   LABS  CBC  Recent Labs  08/21/14 0827  WBC 35.4*  NEUTROABS 31.5*  HGB 11.1*  HCT 34.4*  MCV 80.8  PLT 332   Basic Metabolic Panel  Recent Labs  08/21/2014 0827  NA 139  K 4.0  CL 107  CO2 24    GLUCOSE 164*  BUN 22  CREATININE 1.87*  CALCIUM 9.0   Liver Function Tests  Recent Labs  08/21/2014 0827  AST 34  ALT 16  ALKPHOS 122*  BILITOT 0.9  PROT 6.8  ALBUMIN 3.7    Recent Labs  08/21/2014 0827  LIPASE 25  Cardiac Enzymes  Recent Labs  Jul 30, 2014 0827 July 30, 2014 1441  TROPONINI 2.34* 1.58*    IMAGING: Dg Chest 2 View  2014/07/30   CLINICAL DATA:  Three-day history of shortness of breath. History of bladder carcinoma. Hypertension.  EXAM: CHEST  2 VIEW  COMPARISON:  Chest radiograph March 09, 2013; chest CT March 28, 2014  FINDINGS: There is focal ground-glass type opacity overlying the lateral left hilum measuring approximately 3.2 x 1.8 cm. This finding is appreciable only on the frontal view. Lungs elsewhere clear. Heart size and pulmonary vascularity are normal. No adenopathy. Port-A-Cath tip is in the superior vena cava. No pneumothorax. No bone lesions.  IMPRESSION: Ground-glass type opacity overlying the lateral left perihilar region seen only on the frontal view. This appearance is consistent with pneumonia. Atypical neoplasm could present in this manner as well. This finding warrants a repeat radiograph in approximately 5 days to assess for stability. If this area persists at that time, correlation with chest CT may be advisable to further assess. Elsewhere lungs clear. No demonstrable adenopathy. No pneumothorax apparent.   Electronically Signed   By: Lowella Grip M.D.   On: 07/30/2014 09:13   Nm Pulmonary Perf And Vent  07/30/14   CLINICAL DATA:  Shortness of breath for 3 days.  Chest pain.  EXAM: NUCLEAR MEDICINE VENTILATION - PERFUSION LUNG SCAN  TECHNIQUE: Ventilation images were obtained in multiple projections using inhaled aerosol technetium 99 M DTPA. Perfusion images were obtained in multiple projections after intravenous injection of Tc-51m MAA.  RADIOPHARMACEUTICALS:  Forty-four mCi Tc-49m DTPA aerosol and 5.5 mCi Tc-27m MAA  COMPARISON:  Chest  x-ray 07-30-14  FINDINGS: Ventilation: No focal ventilation defect.  Perfusion: No wedge shaped peripheral perfusion defects to suggest acute pulmonary embolism.  Rounded photopenic defect projecting over the right mid lung likely reflecting the Port-A-Cath.  IMPRESSION: No scintigraphic evidence of pulmonary embolus.   Electronically Signed   By: Kathreen Devoid   On: July 30, 2014 14:33    ECG: NSR, anterior and inferior T wave inversion, QTc 500 ms. Similar, but less pronounced ECG T wave changes seen on older tracings.  TELEMETRY: Reviewed, described above  IMPRESSION: 1. Cardiocirculatory collapse with PEA, appears related to severe metabolic acidosis (lactic acidosis? uroperitoneum?), unresponsive to high dose cathecholamine infusion and NaHCO3 2. Elevated troponins may be a sign of severity of illness or a true NSTEMI - with current available data, unable to tell. The absence of any coronary calcium on CT makes severe CAD unlikely, but not impossible.  RECOMMENDATION: 1. At this point, he seems to have overwhelming metabolic acidosis, possibly due to sepsis and further resuscitation efforts appear to be futile. Presented this sad situation to family and recommended that we cease these efforts.  Time Spent Directly with Patient: 60 minutes  Sanda Klein, MD, [facility] [phone] office [phone] pager   07/30/2014, 5:34 PM

## 2014-08-13 NOTE — ED Provider Notes (Signed)
07/22/2013 17:33 Responded to overhead page of "CODE BLUE" to this patient's room.  Arrived to find the patient supine and better following diuresis ventilations. Pulseless with pulseless bradycardic rhythm on the monitor.  Report is that he had just returned from the bathroom complained of feeling short of breath before becoming unresponsive.  Primary survey obstructed airway undergoing bag assisted ventilations. Breath sounds present bilaterally with bagging assistance. Apneic. No pulse. Monitor shows bradycardic wide complex rhythm.  Interventions patient went continued bag assisted ventilations. Intubated. Positive in tidal CO2, positive bilateral breath sounds.  Angiocath insertion Performed by: Lolita Patella  Consent: Verbal consent obtained. Risks and benefits: risks, benefits and alternatives were discussed Time out: Immediately prior to procedure a "time out" was called to verify the correct patient, procedure, equipment, support staff and site/side marked as required.  Preparation: Patient was prepped and draped in the usual sterile fashion.  Vein Location: Right EJ  Not Ultrasound Guided  Gauge: 20  Normal blood return and flush without difficulty Patient tolerance: Patient tolerated the procedure well with no immediate complications.  Given IV Epi 1mg  push by myself.    After 3 minutes with pulse check patient in sinus tachycardia with positive femoral pulses systolic pressure 80. ABG obtained by myself and sent to lab.  [facility] hospitalist and cardiology present the room. I see that is readily available. Patient will be comforted to the ICU by [facility] staff and nursing.  CRITICAL CARE Performed by: Tanna Furry JOSEPH   Total critical care time: 25 minutes  Critical care time was exclusive of separately billable procedures and treating other patients.  Critical care was necessary to treat or prevent imminent or life-threatening deterioration.  Critical care  was time spent personally by me on the following activities: development of treatment plan with patient and/or surrogate as well as nursing, discussions with consultants, evaluation of patient's response to treatment, examination of patient, obtaining history from patient or surrogate, ordering and performing treatments and interventions, ordering and review of laboratory studies, ordering and review of radiographic studies, pulse oximetry and re-evaluation of patient's condition. Care      Tanna Furry, MD 07/25/14 [phone]

## 2014-08-13 NOTE — Progress Notes (Signed)
ANTIBIOTIC CONSULT NOTE - INITIAL  Pharmacy Consult for vancomycin, cefepime, Levaquin Indication: rule out sepsis  No Known Allergies  Patient Measurements: Height: 5\' 9"  (175.3 cm) Weight: 154 lb (69.854 kg) IBW/kg (Calculated) : 70.7  Vital Signs: Temp: 97.4 F (36.3 C) (01/10 1442) Temp Source: Oral (01/10 1442) BP: 106/64 mmHg (01/10 1442) Pulse Rate: 93 (01/10 1442) Intake/Output from previous day:   Intake/Output from this shift:    Labs:  Recent Labs  07-31-2014 0827  WBC 35.4*  HGB 11.1*  PLT 179  CREATININE 1.87*   Estimated Creatinine Clearance: 38.9 mL/min (by C-G formula based on Cr of 1.87). No results for input(s): VANCOTROUGH, VANCOPEAK, VANCORANDOM, GENTTROUGH, GENTPEAK, GENTRANDOM, TOBRATROUGH, TOBRAPEAK, TOBRARND, AMIKACINPEAK, AMIKACINTROU, AMIKACIN in the last 72 hours.   Microbiology: No results found for this or any previous visit (from the past 720 hour(s)).  Medical History: Past Medical History  Diagnosis Date  . Hypertension   . Bladder mass   . Prostate enlargement   . Hyperlipidemia   . Headache(784.0)     hx. migraines  . LV dysfunction     resolved by 2012 stress test. EF 52%  . Erectile dysfunction   . Nonspecific abnormal unspecified cardiovascular function study     EF 52% in 10/12. Basal inferior wall defect. 7:45 on treadmill.  . Cardiomyopathy     Result  . Cancer     bladder cancer-hx. of past tumors  . Bladder cancer     25 yr history    Medications:  Scheduled:  . [START ON 07/23/2014] antiseptic oral rinse  7 mL Mouth Rinse QID  . aspirin  81 mg Oral q morning - 10a  . chlorhexidine  15 mL Mouth Rinse BID  . feeding supplement (ENSURE COMPLETE)  237 mL Oral BID BM  . insulin aspart  0-15 Units Subcutaneous 6 times per day  . sodium chloride  3 mL Intravenous Q12H   Infusions:  . sodium chloride 75 mL/hr at 07/31/2014 1630  . heparin 850 Units/hr (31-Jul-2014 1053)   Assessment: 64yo M presents with SOB and CP  with exertion over the last 2-3 days. Troponin is elevated. Pharmacy is asked to start heparin for ACS. Baseline CBC ok. PT/INR wnl. SCr elevated, CrCl ~39. No record of renal dysfunction. Patient developed SOB and a code blue was called that resulted in patient being intubated, transferred to ICU and now to start broad spectrum abx's for rule out sepsis  1/10 >> rocephin x 1 1/10 >> Zithromax x 1 1/10 >> vancomycin >> 1/10 >> cefepime >> 1/10 >> Levaquin >>  Tmax: afebrile WBC 35.4 Scr 1.87, CrCl 39 ml/min  1/10 blood:  1/10 urine:   Goal of Therapy:  Vancomycin trough level 15-20 mcg/ml  Plan:  1) Vancomycin 1g IV q24 2) Cefepime 2g IV q24 3) Levaquin 750mg  IV q48   Adrian Saran, PharmD, BCPS Pager [phone] 2014/07/31 5:54 PM

## 2014-08-13 NOTE — Progress Notes (Signed)
During the 5 pm hour, walked by patient's room and noticed call  light on. Went into pt's room and found pt standing at foot of bed entangled in IV and O2 tubings in an effort to go to the bathroom. Assisted in untangling pt and helped him to the bathroom. Wife remained with pt in bathroom. In less than a minute, while sitting on commode pt eyes started to roll back, pt became diaphoretic and passed out. Code button pulled by this rn and  Helped requested. Pt guarded to floor and O2 provided .  Carotid pulse noted with labored respirations. Pt transferred to bed and  and upon code team's  arrival to unit,  Interventions per code blue  protocol started. See cpr record for details. Pt was later transferred to ICU.vwilliams, rn.

## 2014-08-13 NOTE — Progress Notes (Signed)
Patient transferred from the floor , attached to monitor, and patient with no pulse, CPR started, see CPR sheet for treatment.  Levophed drip started per order at 20 mcg per order.  Later during Code/CPR Dopamine drip started at 20 mcg per order.  Patient unresponsive during entire time.  Family at bedside and updated during code.  See CPR sheet for entire code and medications given.  Patient expired at 92.  Patient pronounced per two RN's Inetta Dicke Cheryln Manly, and Jacklynn Lewis RN. Niccolas Loeper Roselie Awkward RN

## 2014-08-13 NOTE — Progress Notes (Signed)
Was in hospital when code blue was announced; responded to rm 1427 where I found pt's daughter in hallway. Introduced self to pt's daughter who took me to consult rm where pt's wife was waiting and tearful. Acted as Chief Strategy Officer worked w/pt until physician could talk w/family. Escorted family to 2nd flr where pt coded again and later passed. Pt's daughter was bedside but too distraught to stay. Escorted her to consult rm where she asked to be alone; outside the rm I listened as she very loudly grieved and dealt w/her emotions. Returned to rm w/mom who stayed w/pt until he passed. She was also very tearful. Family was having a difficult time as pt passing was sudden. Hospital physician and cardiologist (Dr.C.) were very helpful in answering questions, giving information as they found it, and expressing their condolences to pt's family. Stayed w/family and escorted other family members to pt rm as they arrived. Communicated contact info w/staff and informed staff when family left. Ernest Haber Chaplain   08-16-2014 2000  Clinical Encounter Type  Visited With Family

## 2014-08-13 NOTE — ED Notes (Signed)
Spoke with Network engineer Diane on 4th floor. 20 minutes start at 1037

## 2014-08-13 NOTE — H&P (Signed)
[facility] History and Physical  Kenneth Lawrence FBP:[id number] DOB: 1951/06/30 DOA: 08/05/2014  Referring physician: Emergency Department PCP: [name], MD  Specialists: Dr. Alen Blew  Chief Complaint: SOB  HPI: Kenneth Lawrence is a 64 y.o. male  With a hx of actively treated bladder cancer who presents to the ED with 2-3 days hx of worsening sob with productive cough. In the ED, pt was found to be hypoxic with O2 sats of 88% on RA, improved to 93% on 2LNC. Pt denies chest pains. Presenting WBC was found to be 35k, Cr elevated at 1.8, and pt was found to have an elevated lactate of 3.27. The patient was also found to have an elevated trop of 2.34. The patient was given 325mg  of aspirin, heparin gtt ordered, and Cardiology consulted. Hospitalist was consulted for consideration for admission.  On further questioning, pt did note "reflux" symptoms resulting in mid-sternal chest pains improved with belching. Also reports intermittent LLE pain one week ago.  Review of Systems:   Per above, the remainder of the 10pt ros reviewed and are neg  Past Medical History  Diagnosis Date  . Hypertension   . Bladder mass   . Prostate enlargement   . Hyperlipidemia   . Headache(784.0)     hx. migraines  . LV dysfunction     resolved by 2012 stress test. EF 52%  . Erectile dysfunction   . Nonspecific abnormal unspecified cardiovascular function study     EF 52% in 10/12. Basal inferior wall defect. 7:45 on treadmill.  . Cardiomyopathy     Result  . Cancer     bladder cancer-hx. of past tumors  . Bladder cancer     25 yr history   Past Surgical History  Procedure Laterality Date  . Bladder tumor excision      Multiple times , none in 10 yrs  . Transurethral resection of bladder tumor N/A 03/16/2013    Procedure: TRANSURETHRAL RESECTION OF BLADDER TUMOR (TURBT);  Surgeon: Ailene Rud, MD;  Location: [facility];  Service: Urology;  Laterality: N/A;  . Robot assisted laparoscopic  complete cystect ileal conduit N/A 08/11/2013    Procedure: ROBOTIC ASSISTED LAPAROSCOPIC COMPLETE CYSTECTOMY, PROSTATECTOMY,  ILEAL CONDUIT , ALSO CYSTOSCOPY, INDOCYANINE GREEN DYE INJECTION OF TUMOR;  Surgeon: Alexis Frock, MD;  Location: [facility];  Service: Urology;  Laterality: N/A;  . Lymphadenectomy Bilateral 08/11/2013    Procedure: LYMPHADENECTOMY;  Surgeon: Alexis Frock, MD;  Location: [facility];  Service: Urology;  Laterality: Bilateral;   Social History:  reports that he quit smoking about 16 months ago. His smoking use included Cigarettes. He smoked 0.50 packs per day. He does not have any smokeless tobacco history on file. He reports that he drinks alcohol. He reports that he does not use illicit drugs.  where does patient live--home, ALF, SNF? and with whom if at home?  Can patient participate in ADLs?  No Known Allergies  Family History  Problem Relation Age of Onset  . Cancer Mother     breast  . Cancer Father     brain  . Heart disease Father   . Stroke Father   . Prostate cancer Father   . Cancer Sister     breast in 1 sister    (be sure to complete)  Prior to Admission medications   Medication Sig Start Date End Date Taking? Authorizing Provider  acetaminophen (TYLENOL) 500 MG tablet Take 500 mg by mouth every 6 (six) hours as needed (For back  pain.).   Yes Historical Provider, MD  amLODipine (NORVASC) 10 MG tablet Take 10 mg by mouth every morning.   Yes Historical Provider, MD  Ascorbic Acid (VITAMIN C PO) Take 1 tablet by mouth every morning.   Yes Historical Provider, MD  aspirin 81 MG chewable tablet Chew 81 mg by mouth every morning.   Yes Historical Provider, MD  carvedilol (COREG) 12.5 MG tablet Take 12.5 mg by mouth 2 (two) times daily with a meal.   Yes Historical Provider, MD  Cholecalciferol (VITAMIN D-3 PO) Take 1 tablet by mouth every morning.   Yes Historical Provider, MD  hydrochlorothiazide (HYDRODIURIL) 25 MG tablet Take 25 mg by mouth every morning.    Yes Historical Provider, MD  lidocaine-prilocaine (EMLA) cream Apply 1 application topically daily as needed (for port). port-cath   Yes Historical Provider, MD  Omega-3 Fatty Acids (FISH OIL PO) Take 1 capsule by mouth 2 (two) times daily.   Yes Historical Provider, MD  ondansetron (ZOFRAN) 8 MG tablet Take 1 tablet (8 mg total) by mouth every 8 (eight) hours as needed for nausea or vomiting. 06/15/14  Yes Wyatt Portela, MD  oxyCODONE-acetaminophen (PERCOCET/ROXICET) 5-325 MG per tablet Take 1-2 tablets by mouth every 6 (six) hours as needed for moderate pain. Post-operatively. 08/17/13  Yes Alexis Frock, MD  potassium chloride SA (K-DUR,KLOR-CON) 20 MEQ tablet Take 20 mEq by mouth every morning.    Yes Historical Provider, MD  pravastatin (PRAVACHOL) 20 MG tablet Take 20 mg by mouth every morning.   Yes Historical Provider, MD  PRESCRIPTION MEDICATION Receiving chemotherapy at [facility] with Docetaxel every  21 days.  Last treatment on 07/12/14.  Neulasta given 07/14/14. (Dr. Alen Blew)   Yes Historical Provider, MD  senna-docusate (SENOKOT-S) 8.6-50 MG per tablet Take 1 tablet by mouth 2 (two) times daily. While taking pain meds to prevent constipation Patient taking differently: Take 1 tablet by mouth 2 (two) times daily as needed. For constipation. 08/17/13  Yes Alexis Frock, MD   Physical Exam: Filed Vitals:   08-06-2014 0810 08/06/2014 1000 08-06-14 1013 08-06-2014 1055  BP: 101/66 97/64  104/62  Pulse: 91 82  86  Temp: 97.3 F (36.3 C)   97.6 F (36.4 C)  TempSrc: Oral   Oral  Resp: 18 17  22   Height: 5\' 9"  (1.753 m)  5\' 9"  (1.753 m)   Weight: 69.854 kg (154 lb)  69.854 kg (154 lb)   SpO2: 88% 93%  92%     General:  Awake, in nad  Eyes: PERRL B  ENT: membranes moist, dentition fair  Neck: trachea midline, neck supple  Cardiovascular: regular, s1, s2  Respiratory: normal resp effort, no wheezing  Abdomen: soft,nondistended  Skin: normal skin turgor, no abnormal skin lesions  seen  Musculoskeletal: perfused,no clubbing, no calf tenderness B on exam  Psychiatric: mood/affect normal// no auditory/visual hallucinations  Neurologic: cn2-12 grossly intact, strength/sensation intact  Labs on Admission:  Basic Metabolic Panel:  Recent Labs Lab 06-Aug-2014 0827  NA 139  K 4.0  CL 107  CO2 24  GLUCOSE 164*  BUN 22  CREATININE 1.87*  CALCIUM 9.0   Liver Function Tests:  Recent Labs Lab 08/06/14 0827  AST 34  ALT 16  ALKPHOS 122*  BILITOT 0.9  PROT 6.8  ALBUMIN 3.7    Recent Labs Lab 06-Aug-2014 0827  LIPASE 25   No results for input(s): AMMONIA in the last 168 hours. CBC:  Recent Labs Lab 2014-08-06 0827  WBC 35.4*  NEUTROABS 31.5*  HGB 11.1*  HCT 34.4*  MCV 80.8  PLT 179   Cardiac Enzymes:  Recent Labs Lab Aug 13, 2014 0827  TROPONINI 2.34*    BNP (last 3 results) No results for input(s): PROBNP in the last 8760 hours. CBG: No results for input(s): GLUCAP in the last 168 hours.  Radiological Exams on Admission: Dg Chest 2 View  2014-08-13   CLINICAL DATA:  Three-day history of shortness of breath. History of bladder carcinoma. Hypertension.  EXAM: CHEST  2 VIEW  COMPARISON:  Chest radiograph March 09, 2013; chest CT March 28, 2014  FINDINGS: There is focal ground-glass type opacity overlying the lateral left hilum measuring approximately 3.2 x 1.8 cm. This finding is appreciable only on the frontal view. Lungs elsewhere clear. Heart size and pulmonary vascularity are normal. No adenopathy. Port-A-Cath tip is in the superior vena cava. No pneumothorax. No bone lesions.  IMPRESSION: Ground-glass type opacity overlying the lateral left perihilar region seen only on the frontal view. This appearance is consistent with pneumonia. Atypical neoplasm could present in this manner as well. This finding warrants a repeat radiograph in approximately 5 days to assess for stability. If this area persists at that time, correlation with chest CT may  be advisable to further assess. Elsewhere lungs clear. No demonstrable adenopathy. No pneumothorax apparent.   Electronically Signed   By: Lowella Grip M.D.   On: Aug 13, 2014 09:13    EKG: Independently reviewed. Twave inversions at V3-V4  Assessment/Plan Active Problems:   Bladder cancer   Hyperlipidemia, mixed   Essential hypertension, benign   Cardiomyopathy   Leukocytosis   CAP (community acquired pneumonia)   Hypoxia   1. Hypoxia with sob, possible CAP 1. O2 sats appropriate on 3LNC 2. CXR with findings with opacity over L perihilar region suggestive of PNA 3. Will start empiric abx for possible PNA with rocephin and azithromycin 4. Discussed with ED who initially considered CTA to r/o PE, however given Cr of 1.8, would recommend V/Q to avoid contrast nephropathy 5. Will start empiric therapeutic heparin until PE ruled out 2. Elevated troponin 1. Risk factors include prior tobacco abuse, htn, HLD 2. T wave inversions at V3 and V4 3. Will follow serial troponin 4. Will check 2d echo for WMA and for heart function 5. ED to discuss case with Cardiology 6. Will repeat EKG in AM 7. Also consider the possibility of troponin leak given possible pna with sepsis and lactic acidosis 3. Lactic acidosis 1. Will cont on IVF as tolerated 2. Empiric abx per above 4. Cardiomyopathy 1. Will check echo per above 5. HTN 1. BP is stable 2. Cont home meds with exception for diuretics for now 6. HLD 1. Continue statin per home regimen 7. Leukocytosis 1. Possibly related to recent Neulasta use during concurrent chemo vs active infection 2. Follow CBC 3. Start empiric abx per above 8. Bladder cancer 1. Undergoing outpt chemo 2. Followed by Dr. Alen Blew 9. DVT prophylaxis 1. On empiric heparin gtt per above  Code Status: Full (must indicate code status--if unknown or must be presumed, indicate so) Family Communication: Pt in room (indicate person spoken with, if applicable, with  phone number if by telephone) Disposition Plan: pending (indicate anticipated LOS)  Time spent: 67min  Kenneth Lawrence, [facility] Pager [phone]  If 7PM-7AM, please contact night-coverage [email or web address] Password [email or web address] 13-Aug-2014, 11:00 AM

## 2014-08-13 DEATH — deceased

## 2014-08-21 ENCOUNTER — Ambulatory Visit (HOSPITAL_COMMUNITY): Payer: Commercial Managed Care - PPO

## 2014-08-23 ENCOUNTER — Other Ambulatory Visit: Payer: Commercial Managed Care - PPO

## 2014-08-23 ENCOUNTER — Ambulatory Visit: Payer: Commercial Managed Care - PPO

## 2014-08-23 ENCOUNTER — Ambulatory Visit: Payer: Commercial Managed Care - PPO | Admitting: Oncology

## 2014-08-24 ENCOUNTER — Ambulatory Visit: Payer: Commercial Managed Care - PPO

## 2015-04-19 IMAGING — CR DG CHEST 2V
2 series · 2 of 2 positions shown · non-contrast
Comparison: None.

CLINICAL DATA: Preop prostate surgery. History of hypertension,
smoker.

EXAM:
CHEST  2 VIEW

[w chest pa]
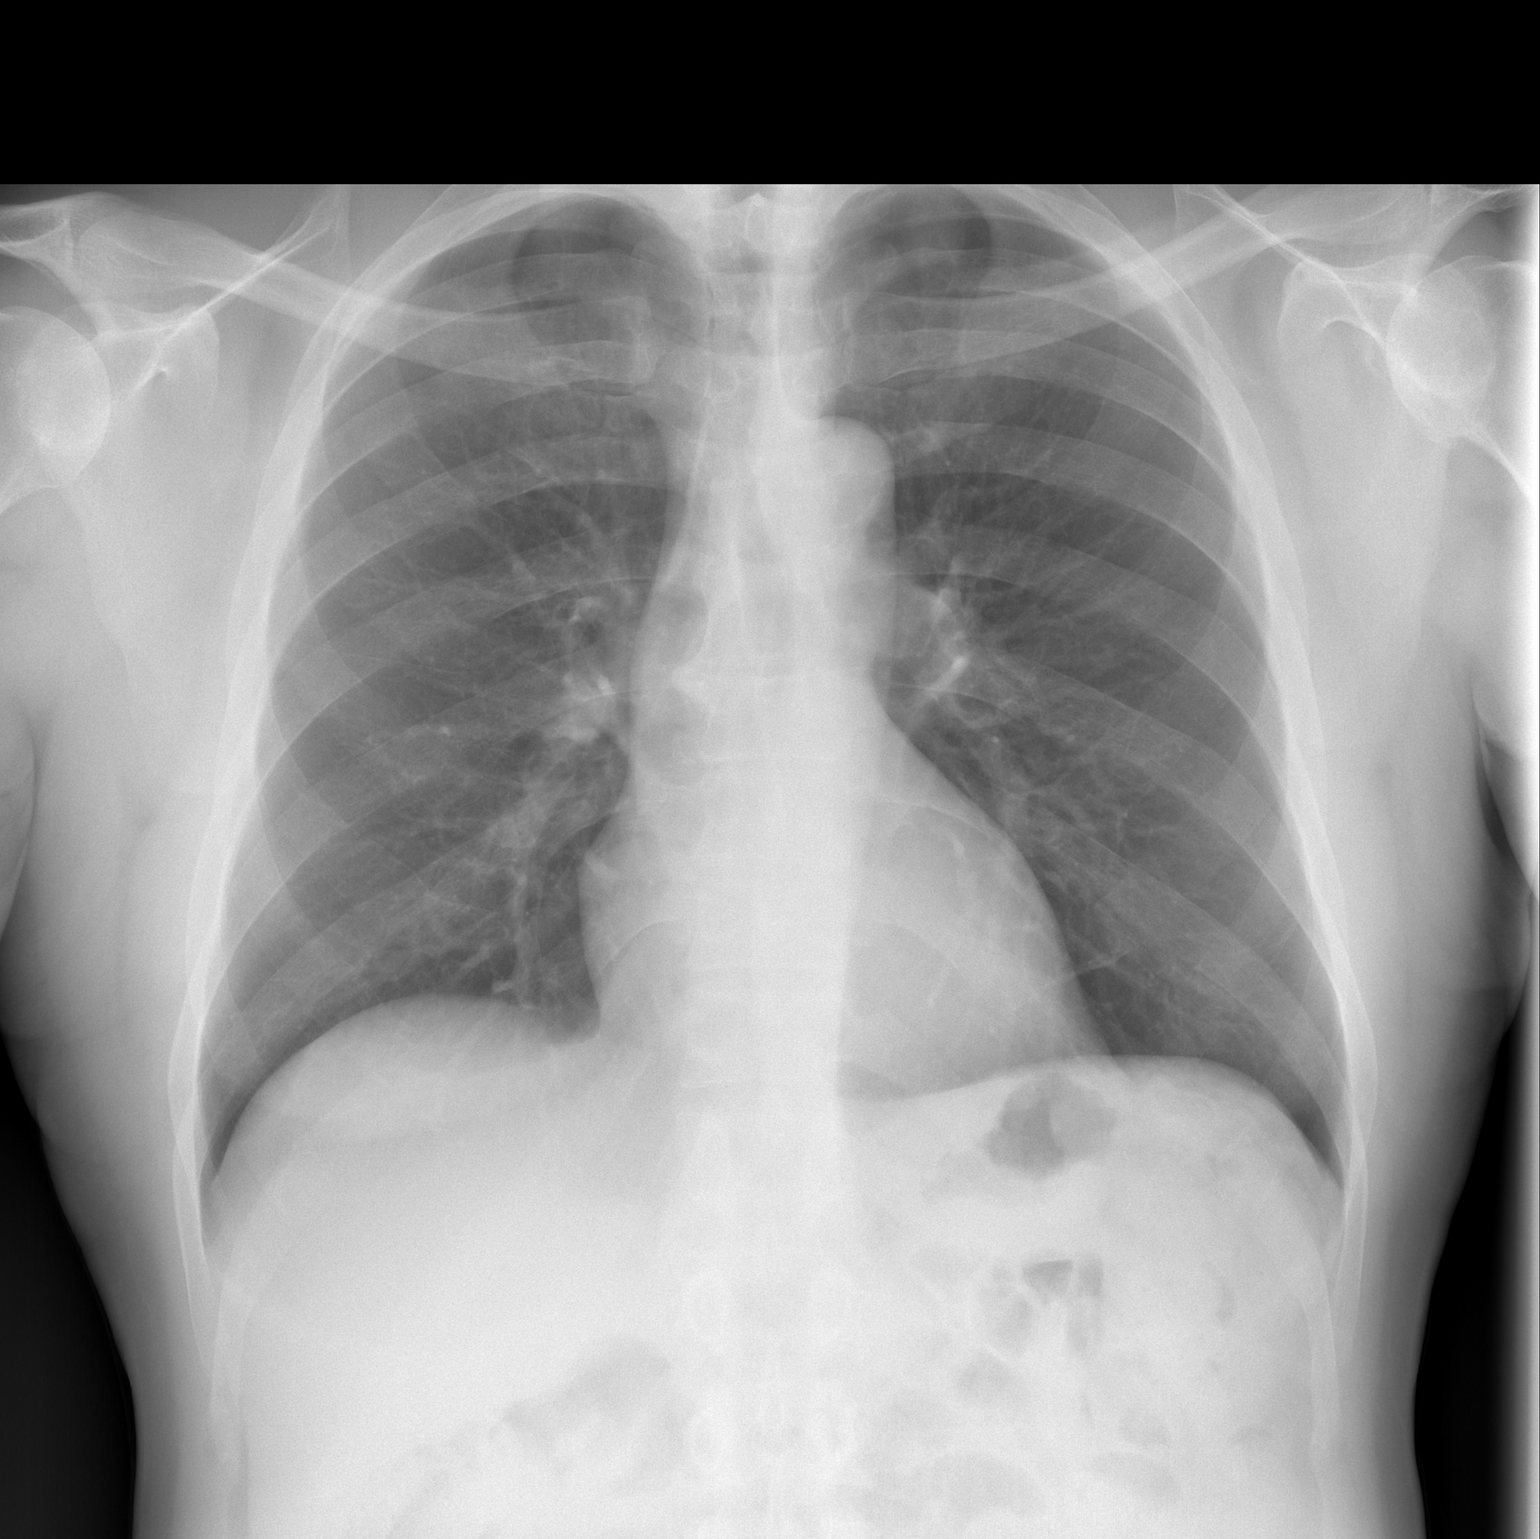

[w chest lat]
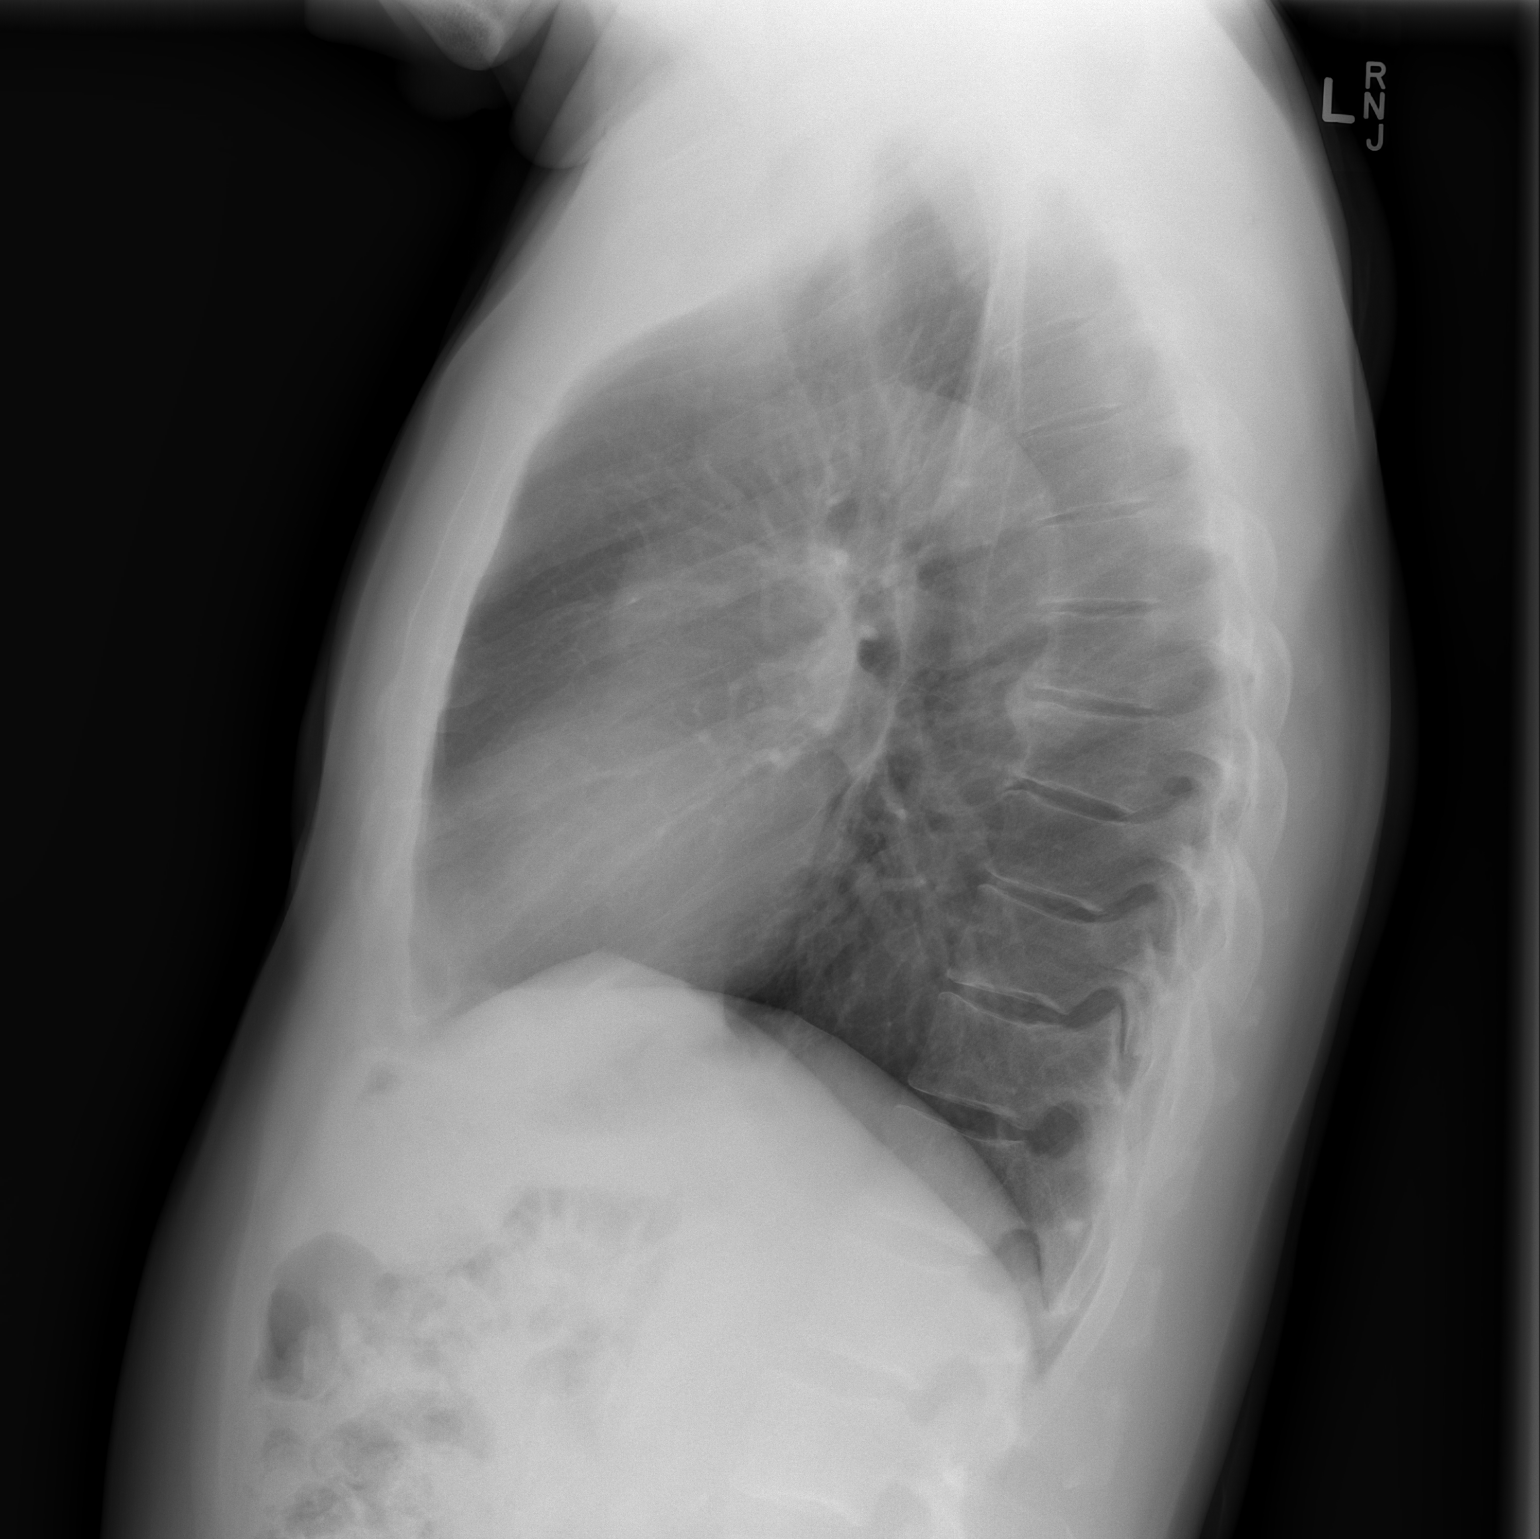

[2 of 2 positions shown; findings below may reference images not displayed]

FINDINGS: The heart size and mediastinal contours are within normal limits.
Both lungs are clear. The visualized skeletal structures are
unremarkable.
IMPRESSION: No active cardiopulmonary disease.

## 2015-06-04 IMAGING — US IR FLUORO GUIDE CV LINE*R*
1 series · 2 of 2 positions shown · non-contrast
Comparison: none

CLINICAL DATA: 62-year-old male with a history of transitional cell
bladder cancer. He requires durable central venous access for
chemotherapy.

EXAM:
IR ULTRASOUND GUIDANCE VASC ACCESS RIGHT; IR RIGHT FLUORO GUIDE CV
LINE
Date: 04/24/2013
TECHNIQUE: The right neck and chest was prepped with chlorhexidine, and draped
in the usual sterile fashion using maximum barrier technique (cap
and mask, sterile gown, sterile gloves, large sterile sheet, hand
hygiene and cutaneous antiseptic). Antibiotic prophylaxis was
provided with 2g Ancef administered IV one hour prior to skin
incision. Local anesthesia was attained by infiltration with 1%
lidocaine with epinephrine.

[Series 1: ir us guide vasc access*right* · 2 of 2 slices shown]
[im 1/2]
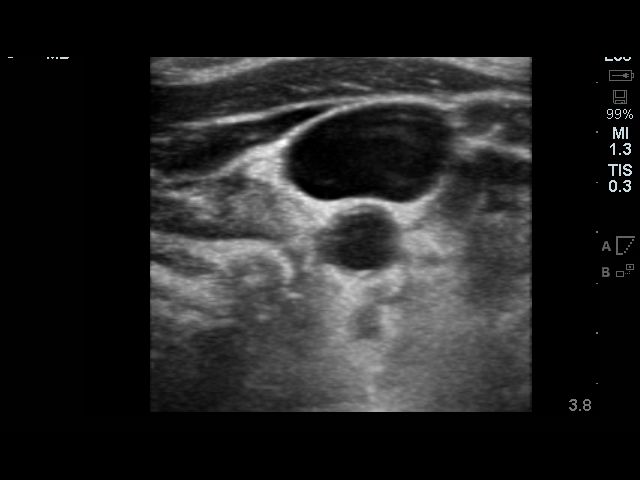
[im 2/2]
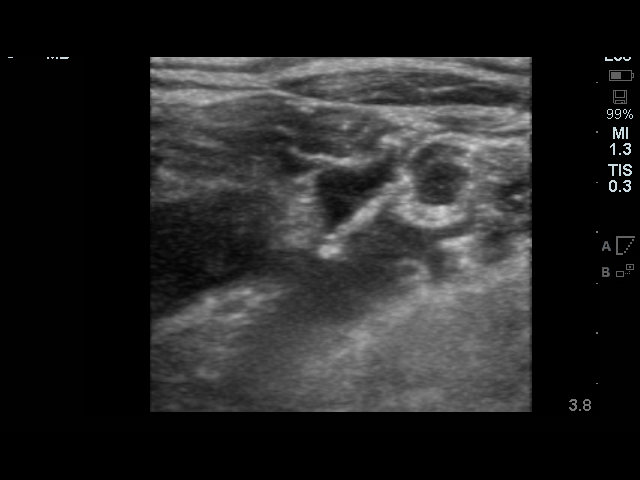

[2 of 2 positions shown; findings below may reference images not displayed]

Ultrasound demonstrated patency of the right internal jugular vein,
and this was documented with an image. Under real-time ultrasound
guidance, this vein was accessed with a 21 gauge micropuncture
needle and image documentation was performed. A small dermatotomy
was made at the access site with an 11 scalpel. A 0.018" wire was
advanced into the SVC and the access needle exchanged for a 4F
micropuncture vascular sheath. The 0.018" wire was then removed and
a 0.035" wire advanced into the IVC.



The venous access site was then serially dilated and a peel away
vascular sheath placed over the wire. The wire was removed and the
port catheter advanced into position under fluoroscopic guidance.
The catheter tip is positioned in the superior cavoatrial junction.
This was documented with a spot image. The portacatheter was then
tested and found to flush and aspirate well. The port was flushed
with saline followed by 100 units/mL heparinized saline.

The pocket was then closed in two layers using first subdermal
inverted interrupted absorbable sutures followed by a running
subcuticular suture. The epidermis was then sealed with Dermabond.
The dermatotomy at the venous access site was also closed with a
single inverted subdermal suture and the epidermis sealed with
Dermabond.

ANESTHESIA/SEDATION:
Moderate (conscious) sedation was administered during this
procedure. A total of 2 mg Versed and 100 mg Fentanyl were
administered intravenously. The patient's vital signs were monitored
continuously by radiology nursing throughout the course of the
procedure.

Total sedation time: 22 minutes

FLUOROSCOPY TIME:  36 seconds

COMPLICATIONS:
None.  The patient tolerated the procedure well.
IMPRESSION: Successful placement of a right IJ approach Power Port with
ultrasound and fluoroscopic guidance. The catheter is ready for use.

## 2015-12-27 ENCOUNTER — Other Ambulatory Visit: Payer: Self-pay | Admitting: Nurse Practitioner

## 2016-06-30 IMAGING — CT CT BIOPSY
2 of 3 series · 10 of 32 positions shown, 13 images · non-contrast
Comparison: none

CLINICAL DATA: 53-year-old male with a history of bladder cancer.
Recent PET-CT demonstrates multiple small metabolically active
nodules in the region of the surgical resection bed and also
posterior to the descending colon in the left retroperitoneal
region. Tissue biopsy is warranted to evaluate for local recurrence.
TECHNIQUE: Informed consent was obtained from the patient following explanation
of the procedure, risks, benefits and alternatives. The patient
understands, agrees and consents for the procedure. All questions
were addressed. A time out was performed.

[Series 6: bone marrow bx · axial · 0.37mm/px · z∈[+982,+986]mm · 8 of 76 slices shown]
[im 7/76  bone]
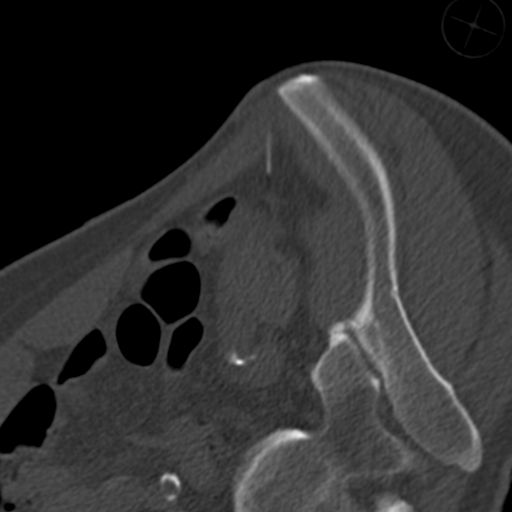
[im 16/76  bone]
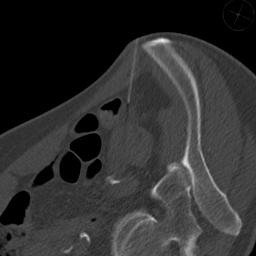
[im 26/76  bone]
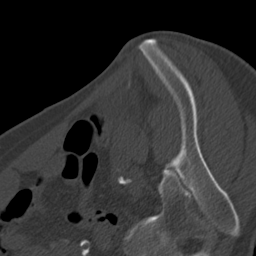
[im 35/76  bone]
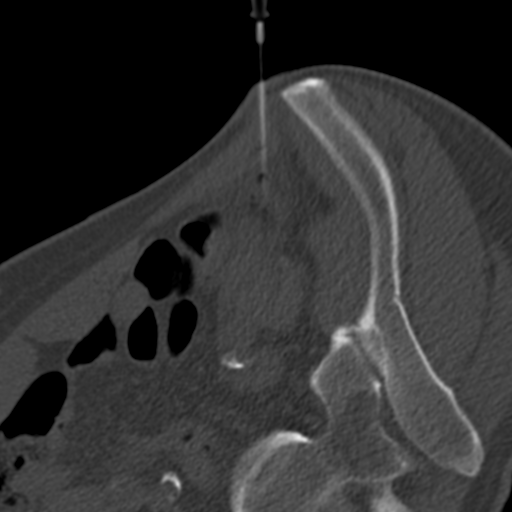
[im 41/76  bone]
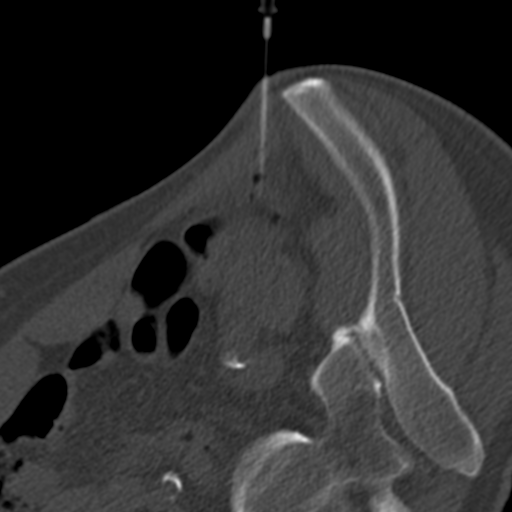
[im 51/76  bone]
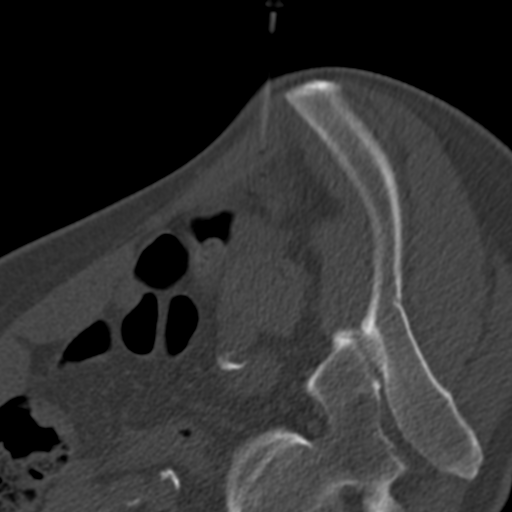
[im 60/76  bone]
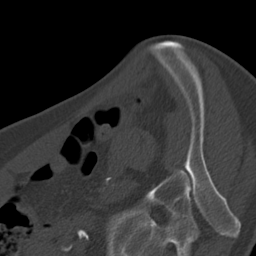
[im 69/76  bone]
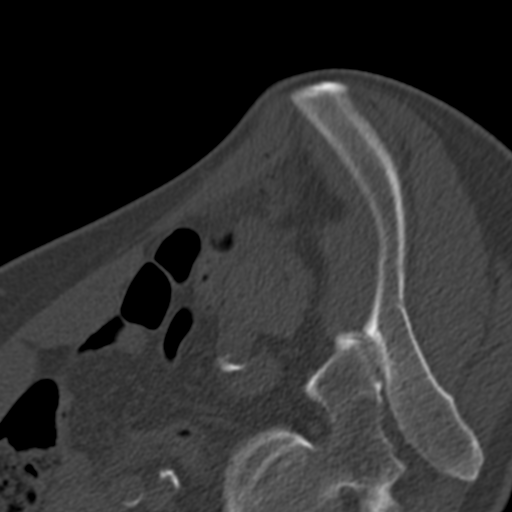

[Series 10: post · axial · 0.37mm/px · z∈[+954,+974]mm · 2 of 12 slices shown, 5 images]
[im 4/12  soft-tissue]
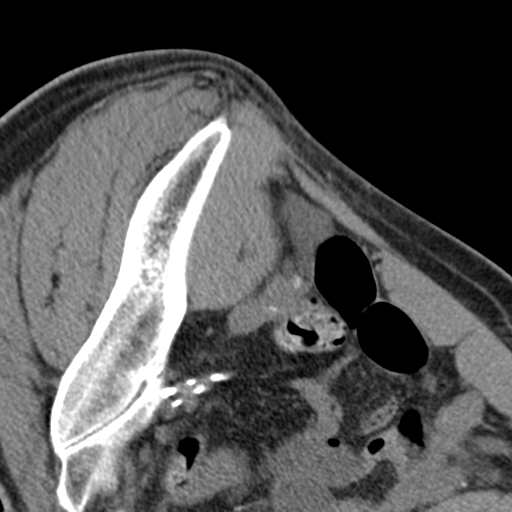
[im 4/12  lung]
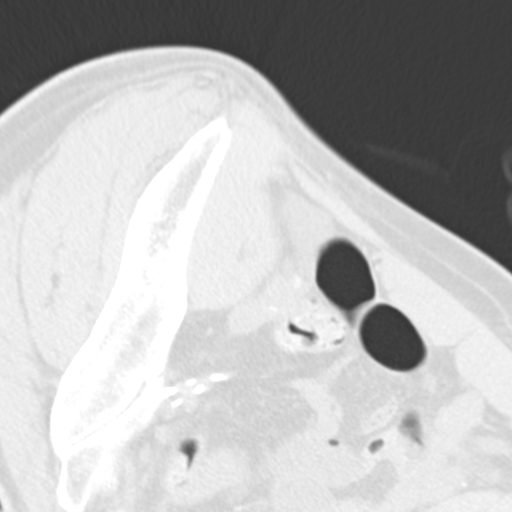
[im 4/12  bone]
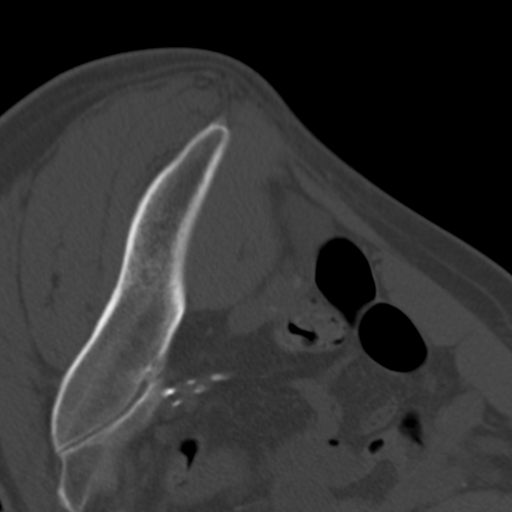
[im 8/12  soft-tissue]
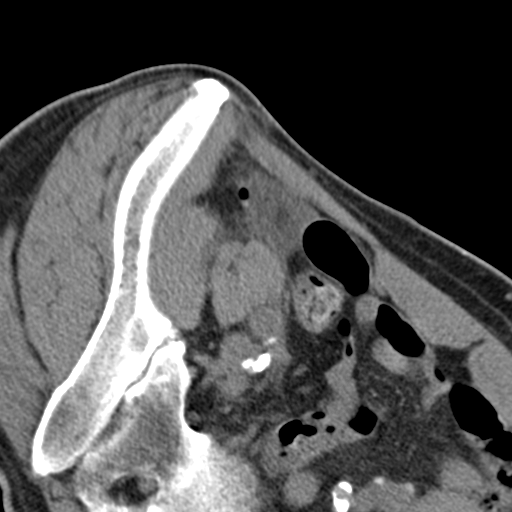
[im 8/12  lung]
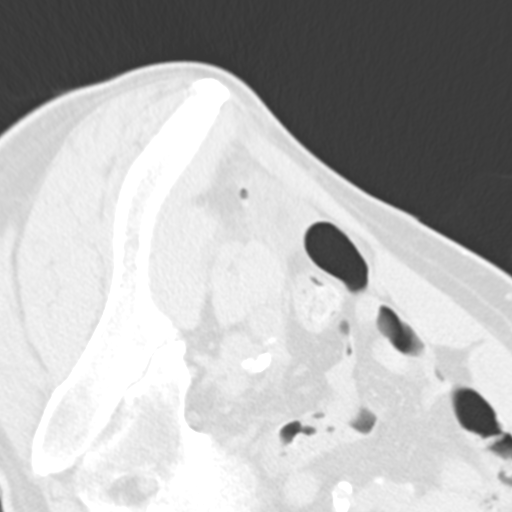

[10 of 32 positions shown; findings below may reference images not displayed]

EXAM:
CT BIOPSY

Date: 05/21/2014

PROCEDURE:
1. CT-guided biopsy

ANESTHESIA/SEDATION:
Moderate (conscious) sedation was used. 4 mg Versed, 50 mcg Fentanyl
were administered intravenously. The patient's vital signs were
monitored continuously by radiology nursing throughout the
procedure.

Sedation Time: 25 minutes
A planning axial CT scan was performed. The nodule anterior to the
left psoas muscle was successfully identified. An appropriate skin
entry site was selected and marked. The region was then sterilely
prepped and draped in standard fashion using Betadine skin prep.

Local anesthesia was attained by infiltration with 1% lidocaine.
Under intermittent CT fluoroscopic guidance, first a 22 gauge Chiba
needle was advanced between the rectum and the nodule. Hydro
dissection was then performed using 30 mL sterile saline.

The 17 gauge introducer needle was then advanced to the margin of
the nodule, again under intermittent CT fluoroscopic guidance.
Multiple 18 gauge core biopsies were then coaxially obtained. Biopsy
specimens were placed in formalin and delivered to pathology for
further analysis. Post biopsy axial CT imaging demonstrates no
evidence of immediate complication. The patient tolerated the
procedure well.

COMPLICATIONS:
None
IMPRESSION: Technically successful CT-guided biopsy of left lower quadrant
retroperitoneal soft tissue nodule.
